# Patient Record
Sex: Female | Born: 1970
Health system: Southern US, Community
[De-identification: ages and names within clinical notes are randomized; demographics above are authoritative.]

## PROBLEM LIST (undated history)

## (undated) DIAGNOSIS — Z9889 Other specified postprocedural states: Secondary | ICD-10-CM

## (undated) DIAGNOSIS — I7300 Raynaud's syndrome without gangrene: Secondary | ICD-10-CM

## (undated) DIAGNOSIS — T4145XA Adverse effect of unspecified anesthetic, initial encounter: Secondary | ICD-10-CM

## (undated) DIAGNOSIS — R112 Nausea with vomiting, unspecified: Secondary | ICD-10-CM

## (undated) DIAGNOSIS — M5412 Radiculopathy, cervical region: Secondary | ICD-10-CM

## (undated) DIAGNOSIS — C801 Malignant (primary) neoplasm, unspecified: Secondary | ICD-10-CM

## (undated) DIAGNOSIS — T8859XA Other complications of anesthesia, initial encounter: Secondary | ICD-10-CM

## (undated) HISTORY — PX: ABDOMINAL HYSTERECTOMY: SHX81

---

## 2011-05-14 ENCOUNTER — Ambulatory Visit: Payer: Self-pay

## 2011-05-29 ENCOUNTER — Ambulatory Visit: Payer: Self-pay | Admitting: Internal Medicine

## 2012-10-02 ENCOUNTER — Ambulatory Visit: Payer: Self-pay | Admitting: Family Medicine

## 2015-10-06 ENCOUNTER — Other Ambulatory Visit: Payer: Self-pay | Admitting: Nurse Practitioner

## 2015-10-06 DIAGNOSIS — Z1231 Encounter for screening mammogram for malignant neoplasm of breast: Secondary | ICD-10-CM

## 2015-10-11 ENCOUNTER — Ambulatory Visit: Admission: RE | Admit: 2015-10-11 | Payer: Self-pay | Source: Ambulatory Visit

## 2015-10-18 ENCOUNTER — Ambulatory Visit: Payer: Self-pay

## 2015-11-01 ENCOUNTER — Ambulatory Visit
Admission: RE | Admit: 2015-11-01 | Discharge: 2015-11-01 | Disposition: A | Discharge status: Home / Self Care | Payer: BLUE CROSS/BLUE SHIELD | Source: Ambulatory Visit | Attending: Nurse Practitioner | Admitting: Nurse Practitioner

## 2015-11-01 DIAGNOSIS — Z1231 Encounter for screening mammogram for malignant neoplasm of breast: Secondary | ICD-10-CM | POA: Diagnosis present

## 2015-11-01 HISTORY — DX: Malignant (primary) neoplasm, unspecified: C80.1

## 2017-08-08 DIAGNOSIS — X32XXXA Exposure to sunlight, initial encounter: Secondary | ICD-10-CM | POA: Diagnosis not present

## 2017-08-08 DIAGNOSIS — D2261 Melanocytic nevi of right upper limb, including shoulder: Secondary | ICD-10-CM | POA: Diagnosis not present

## 2017-08-08 DIAGNOSIS — Z85820 Personal history of malignant melanoma of skin: Secondary | ICD-10-CM | POA: Diagnosis not present

## 2017-08-08 DIAGNOSIS — D225 Melanocytic nevi of trunk: Secondary | ICD-10-CM | POA: Diagnosis not present

## 2017-08-08 DIAGNOSIS — Z08 Encounter for follow-up examination after completed treatment for malignant neoplasm: Secondary | ICD-10-CM | POA: Diagnosis not present

## 2017-08-08 DIAGNOSIS — L570 Actinic keratosis: Secondary | ICD-10-CM | POA: Diagnosis not present

## 2017-08-13 ENCOUNTER — Other Ambulatory Visit: Payer: Self-pay | Admitting: Nurse Practitioner

## 2017-08-13 DIAGNOSIS — M542 Cervicalgia: Secondary | ICD-10-CM | POA: Diagnosis not present

## 2017-08-13 DIAGNOSIS — Z0000 Encounter for general adult medical examination without abnormal findings: Secondary | ICD-10-CM | POA: Diagnosis not present

## 2017-08-13 DIAGNOSIS — Z1231 Encounter for screening mammogram for malignant neoplasm of breast: Secondary | ICD-10-CM | POA: Diagnosis not present

## 2017-08-13 DIAGNOSIS — Z79899 Other long term (current) drug therapy: Secondary | ICD-10-CM | POA: Diagnosis not present

## 2017-08-23 DIAGNOSIS — M5412 Radiculopathy, cervical region: Secondary | ICD-10-CM | POA: Diagnosis not present

## 2017-09-13 ENCOUNTER — Ambulatory Visit
Admission: RE | Admit: 2017-09-13 | Discharge: 2017-09-13 | Disposition: A | Discharge status: Home / Self Care | Payer: 59 | Source: Ambulatory Visit | Attending: Nurse Practitioner | Admitting: Nurse Practitioner

## 2017-09-13 DIAGNOSIS — Z1231 Encounter for screening mammogram for malignant neoplasm of breast: Secondary | ICD-10-CM | POA: Diagnosis not present

## 2017-10-03 ENCOUNTER — Other Ambulatory Visit: Payer: 59

## 2018-01-02 ENCOUNTER — Other Ambulatory Visit: Payer: Self-pay

## 2018-01-02 ENCOUNTER — Encounter
Admission: RE | Admit: 2018-01-02 | Discharge: 2018-01-02 | Disposition: A | Discharge status: Home / Self Care | Payer: 59 | Source: Ambulatory Visit | Attending: Neurosurgery | Admitting: Neurosurgery

## 2018-01-02 ENCOUNTER — Other Ambulatory Visit: Payer: 59

## 2018-01-02 DIAGNOSIS — Z01818 Encounter for other preprocedural examination: Secondary | ICD-10-CM | POA: Insufficient documentation

## 2018-01-02 DIAGNOSIS — I498 Other specified cardiac arrhythmias: Secondary | ICD-10-CM | POA: Diagnosis not present

## 2018-01-02 DIAGNOSIS — Z0183 Encounter for blood typing: Secondary | ICD-10-CM | POA: Insufficient documentation

## 2018-01-02 DIAGNOSIS — Z01810 Encounter for preprocedural cardiovascular examination: Secondary | ICD-10-CM | POA: Diagnosis not present

## 2018-01-02 DIAGNOSIS — Z01812 Encounter for preprocedural laboratory examination: Secondary | ICD-10-CM | POA: Insufficient documentation

## 2018-01-02 HISTORY — DX: Radiculopathy, cervical region: M54.12

## 2018-01-02 HISTORY — DX: Nausea with vomiting, unspecified: R11.2

## 2018-01-02 HISTORY — DX: Adverse effect of unspecified anesthetic, initial encounter: T41.45XA

## 2018-01-02 HISTORY — DX: Raynaud's syndrome without gangrene: I73.00

## 2018-01-02 HISTORY — DX: Other specified postprocedural states: Z98.890

## 2018-01-02 HISTORY — DX: Other complications of anesthesia, initial encounter: T88.59XA

## 2018-01-02 LAB — BASIC METABOLIC PANEL
Anion gap: 7 (ref 5–15)
BUN: 19 mg/dL (ref 6–20)
CALCIUM: 8.9 mg/dL (ref 8.9–10.3)
CO2: 27 mmol/L (ref 22–32)
Chloride: 105 mmol/L (ref 98–111)
Creatinine, Ser: 0.6 mg/dL (ref 0.44–1.00)
GFR calc Af Amer: >60 mL/min (ref >60)
Glucose, Bld: 94 mg/dL (ref 70–99)
POTASSIUM: 4.3 mmol/L (ref 3.5–5.1)
SODIUM: 139 mmol/L (ref 135–145)

## 2018-01-02 LAB — URINALYSIS, ROUTINE W REFLEX MICROSCOPIC
Bacteria, UA: NONE SEEN
Bilirubin Urine: NEGATIVE
Glucose, UA: NEGATIVE mg/dL
HGB URINE DIPSTICK: NEGATIVE
Ketones, ur: NEGATIVE mg/dL
Leukocytes, UA: NEGATIVE
Nitrite: NEGATIVE
Protein, ur: NEGATIVE mg/dL
SPECIFIC GRAVITY, URINE: 1.031 — AB (ref 1.005–1.030)
SQUAMOUS EPITHELIAL / LPF: NONE SEEN (ref 0–5)
WBC, UA: NONE SEEN WBC/hpf (ref 0–5)
pH: 5 (ref 5.0–8.0)

## 2018-01-02 LAB — TYPE AND SCREEN
ABO/RH(D): O POS
ANTIBODY SCREEN: NEGATIVE

## 2018-01-02 LAB — CBC
HCT: 34.9 % — ABNORMAL LOW (ref 35.0–47.0)
Hemoglobin: 12.1 g/dL (ref 12.0–16.0)
MCH: 30.9 pg (ref 26.0–34.0)
MCHC: 34.7 g/dL (ref 32.0–36.0)
MCV: 89.1 fL (ref 80.0–100.0)
PLATELETS: 280 10*3/uL (ref 150–440)
RBC: 3.92 MIL/uL (ref 3.80–5.20)
RDW: 14.2 % (ref 11.5–14.5)
WBC: 9.5 10*3/uL (ref 3.6–11.0)

## 2018-01-02 LAB — SURGICAL PCR SCREEN
MRSA, PCR: NEGATIVE
Staphylococcus aureus: NEGATIVE

## 2018-01-02 LAB — DIFFERENTIAL
BASOS ABS: 0.1 10*3/uL (ref 0–0.1)
BASOS PCT: 1 %
Eosinophils Absolute: 0.3 10*3/uL (ref 0–0.7)
Eosinophils Relative: 3 %
Lymphocytes Relative: 29 %
Lymphs Abs: 2.8 10*3/uL (ref 1.0–3.6)
MONO ABS: 0.6 10*3/uL (ref 0.2–0.9)
Monocytes Relative: 6 %
NEUTROS ABS: 5.7 10*3/uL (ref 1.4–6.5)
Neutrophils Relative %: 61 %

## 2018-01-02 LAB — APTT: aPTT: 30 seconds (ref 24–36)

## 2018-01-02 LAB — PROTIME-INR
INR: 0.97
PROTHROMBIN TIME: 12.8 s (ref 11.4–15.2)

## 2018-01-02 NOTE — Patient Instructions (Signed)
Your procedure is scheduled on: Wednesday 01/09/18 Report to [facility]. To find out your arrival time please call [phone] between 1PM - 3PM on Tuesday 01/08/18.  Remember: Instructions that are not followed completely may result in serious medical risk, up to and including death, or upon the discretion of your surgeon and anesthesiologist your surgery may need to be rescheduled.     _X__ 1. Do not eat food after midnight the night before your procedure.                 No gum chewing or hard candies. You may drink clear liquids up to 2 hours                 before you are scheduled to arrive for your surgery- DO not drink clear                 liquids within 2 hours of the start of your surgery.                 Clear Liquids include:  water, apple juice without pulp, clear carbohydrate                 drink such as Clearfast or Gatorade, Black Coffee or Tea (Do not add                 anything to coffee or tea).  __X__2.  On the morning of surgery brush your teeth with toothpaste and water, you                 may rinse your mouth with mouthwash if you wish.  Do not swallow any              toothpaste of mouthwash.     _X__ 3.  No Alcohol for 24 hours before or after surgery.   _X__ 4.  Do Not Smoke or use e-cigarettes For 24 Hours Prior to Your Surgery.                 Do not use any chewable tobacco products for at least 6 hours prior to                 surgery.  ____  5.  Bring all medications with you on the day of surgery if instructed.   __X__  6.  Notify your doctor if there is any change in your medical condition      (cold, fever, infections).     Do not wear jewelry, make-up, hairpins, clips or nail polish. Do not wear lotions, powders, or perfumes.  Do not shave 48 hours prior to surgery. Men may shave face and neck. Do not bring valuables to the hospital.    [facility] is not responsible for any belongings or  valuables.  Contacts, dentures/partials or body piercings may not be worn into surgery. Bring a case for your contacts, glasses or hearing aids, a denture cup will be supplied. Leave your suitcase in the car. After surgery it may be brought to your room. For patients admitted to the hospital, discharge time is determined by your treatment team.   Patients discharged the day of surgery will not be allowed to drive home.   Please read over the following fact sheets that you were given:   MRSA Information  __X__ Take these medicines the morning of surgery with A SIP OF WATER:  1. NONE  2.   3.   4.  5.  6.  ____ Fleet Enema (as directed)   __X__ Use CHG Soap/SAGE wipes as directed  ____ Use inhalers on the day of surgery  ____ Stop metformin/Janumet/Farxiga 2 days prior to surgery    ____ Take 1/2 of usual insulin dose the night before surgery. No insulin the morning          of surgery.   ____ Stop Blood Thinners Coumadin/Plavix/Xarelto/Pleta/Pradaxa/Eliquis/Effient/Aspirin  on   Or contact your Surgeon, Cardiologist or Medical Doctor regarding  ability to stop your blood thinners  __X__ Stop Anti-inflammatories 7 days before surgery such as Advil, Ibuprofen, Motrin, BC or Goodies Powder, Naprosyn, Naproxen, Aleve, Aspirin YOU MAY TAKE YOUR TYLENOL   __X__ Stop all herbal supplements, fish oil or vitamin E until after surgery.    ____ Bring C-Pap to the hospital.

## 2018-01-09 ENCOUNTER — Ambulatory Visit: Payer: 59

## 2018-01-09 ENCOUNTER — Encounter: Payer: Self-pay | Admitting: *Deleted

## 2018-01-09 ENCOUNTER — Ambulatory Visit
Admission: RE | Admit: 2018-01-09 | Discharge: 2018-01-09 | Disposition: A | Discharge status: Home / Self Care | Payer: 59 | Source: Ambulatory Visit | Attending: Neurosurgery | Admitting: Neurosurgery

## 2018-01-09 ENCOUNTER — Encounter
Admission: RE | Disposition: A | Discharge status: Home / Self Care | Payer: Self-pay | Source: Ambulatory Visit | Attending: Neurosurgery

## 2018-01-09 ENCOUNTER — Other Ambulatory Visit: Payer: Self-pay

## 2018-01-09 DIAGNOSIS — Z85820 Personal history of malignant melanoma of skin: Secondary | ICD-10-CM | POA: Insufficient documentation

## 2018-01-09 DIAGNOSIS — I7300 Raynaud's syndrome without gangrene: Secondary | ICD-10-CM | POA: Diagnosis not present

## 2018-01-09 DIAGNOSIS — M2578 Osteophyte, vertebrae: Secondary | ICD-10-CM | POA: Insufficient documentation

## 2018-01-09 DIAGNOSIS — Z87891 Personal history of nicotine dependence: Secondary | ICD-10-CM | POA: Diagnosis not present

## 2018-01-09 DIAGNOSIS — Z981 Arthrodesis status: Secondary | ICD-10-CM

## 2018-01-09 DIAGNOSIS — Z90710 Acquired absence of both cervix and uterus: Secondary | ICD-10-CM | POA: Insufficient documentation

## 2018-01-09 DIAGNOSIS — Z79899 Other long term (current) drug therapy: Secondary | ICD-10-CM | POA: Diagnosis not present

## 2018-01-09 DIAGNOSIS — Z803 Family history of malignant neoplasm of breast: Secondary | ICD-10-CM | POA: Diagnosis not present

## 2018-01-09 DIAGNOSIS — Z419 Encounter for procedure for purposes other than remedying health state, unspecified: Secondary | ICD-10-CM

## 2018-01-09 DIAGNOSIS — M4322 Fusion of spine, cervical region: Secondary | ICD-10-CM | POA: Diagnosis not present

## 2018-01-09 DIAGNOSIS — I739 Peripheral vascular disease, unspecified: Secondary | ICD-10-CM | POA: Diagnosis not present

## 2018-01-09 DIAGNOSIS — M4802 Spinal stenosis, cervical region: Secondary | ICD-10-CM | POA: Insufficient documentation

## 2018-01-09 DIAGNOSIS — M5412 Radiculopathy, cervical region: Secondary | ICD-10-CM | POA: Insufficient documentation

## 2018-01-09 HISTORY — PX: CERVICAL DISC ARTHROPLASTY: SHX587

## 2018-01-09 LAB — ABO/RH: ABO/RH(D): O POS

## 2018-01-09 SURGERY — CERVICAL ANTERIOR DISC ARTHROPLASTY
Anesthesia: General | Site: Neck | Wound class: Clean

## 2018-01-09 MED ORDER — PROPOFOL 10 MG/ML IV BOLUS
INTRAVENOUS | Status: AC
  Filled 2018-01-09: qty 40

## 2018-01-09 MED ORDER — ROCURONIUM BROMIDE 100 MG/10ML IV SOLN
INTRAVENOUS | Status: DC | PRN
  Administered 2018-01-09: 10 mg via INTRAVENOUS
  Administered 2018-01-09: 5 mg via INTRAVENOUS
  Administered 2018-01-09: 40 mg via INTRAVENOUS
  Administered 2018-01-09: 5 mg via INTRAVENOUS

## 2018-01-09 MED ORDER — LACTATED RINGERS IV SOLN
INTRAVENOUS | Status: DC
  Administered 2018-01-09: 07:00:00 via INTRAVENOUS

## 2018-01-09 MED ORDER — SCOPOLAMINE 1 MG/3DAYS TD PT72
MEDICATED_PATCH | TRANSDERMAL | Status: AC
  Filled 2018-01-09: qty 1

## 2018-01-09 MED ORDER — GLYCOPYRROLATE 0.2 MG/ML IJ SOLN
INTRAMUSCULAR | Status: AC
  Filled 2018-01-09: qty 1

## 2018-01-09 MED ORDER — HYDROMORPHONE HCL 1 MG/ML IJ SOLN
INTRAMUSCULAR | Status: DC | PRN
  Administered 2018-01-09: .4 mg via INTRAVENOUS
  Administered 2018-01-09: .2 mg via INTRAVENOUS

## 2018-01-09 MED ORDER — MIDAZOLAM HCL 2 MG/2ML IJ SOLN
INTRAMUSCULAR | Status: AC
  Filled 2018-01-09: qty 2

## 2018-01-09 MED ORDER — DEXMEDETOMIDINE HCL IN NACL 200 MCG/50ML IV SOLN
INTRAVENOUS | Status: AC
  Filled 2018-01-09: qty 50

## 2018-01-09 MED ORDER — LACTATED RINGERS IV SOLN
INTRAVENOUS | Status: DC | PRN
  Administered 2018-01-09: 08:00:00 via INTRAVENOUS

## 2018-01-09 MED ORDER — BUPIVACAINE-EPINEPHRINE (PF) 0.5% -1:200000 IJ SOLN
INTRAMUSCULAR | Status: AC
  Filled 2018-01-09: qty 30

## 2018-01-09 MED ORDER — METHOCARBAMOL 500 MG PO TABS
ORAL_TABLET | ORAL | Status: AC
  Administered 2018-01-09: 500 mg
  Filled 2018-01-09: qty 1

## 2018-01-09 MED ORDER — ONDANSETRON HCL 4 MG/2ML IJ SOLN
INTRAMUSCULAR | Status: AC
  Filled 2018-01-09: qty 2

## 2018-01-09 MED ORDER — FENTANYL CITRATE (PF) 100 MCG/2ML IJ SOLN
25.0000 ug | INTRAMUSCULAR | Status: DC | PRN
  Administered 2018-01-09 (×4): 25 ug via INTRAVENOUS

## 2018-01-09 MED ORDER — FENTANYL CITRATE (PF) 100 MCG/2ML IJ SOLN
INTRAMUSCULAR | Status: DC | PRN
  Administered 2018-01-09 (×2): 50 ug via INTRAVENOUS

## 2018-01-09 MED ORDER — LIDOCAINE HCL (CARDIAC) PF 100 MG/5ML IV SOSY
PREFILLED_SYRINGE | INTRAVENOUS | Status: DC | PRN
  Administered 2018-01-09: 40 mg via INTRAVENOUS

## 2018-01-09 MED ORDER — FAMOTIDINE 20 MG PO TABS
20.0000 mg | ORAL_TABLET | Freq: Once | ORAL | Status: AC
  Administered 2018-01-09: 20 mg via ORAL

## 2018-01-09 MED ORDER — SODIUM CHLORIDE 0.9 % IJ SOLN
INTRAMUSCULAR | Status: AC
  Filled 2018-01-09: qty 50

## 2018-01-09 MED ORDER — METHYLPREDNISOLONE 4 MG PO TBPK: ORAL_TABLET | ORAL | 0 refills | Status: AC

## 2018-01-09 MED ORDER — ONDANSETRON HCL 4 MG/2ML IJ SOLN
INTRAMUSCULAR | Status: DC | PRN
  Administered 2018-01-09: 4 mg via INTRAVENOUS

## 2018-01-09 MED ORDER — FAMOTIDINE 20 MG PO TABS
ORAL_TABLET | ORAL | Status: AC
  Filled 2018-01-09: qty 1

## 2018-01-09 MED ORDER — MIDAZOLAM HCL 2 MG/2ML IJ SOLN
INTRAMUSCULAR | Status: DC | PRN
  Administered 2018-01-09: 2 mg via INTRAVENOUS

## 2018-01-09 MED ORDER — DEXAMETHASONE SODIUM PHOSPHATE 10 MG/ML IJ SOLN
INTRAMUSCULAR | Status: AC
  Filled 2018-01-09: qty 1

## 2018-01-09 MED ORDER — SCOPOLAMINE 1 MG/3DAYS TD PT72
1.0000 | MEDICATED_PATCH | Freq: Once | TRANSDERMAL | Status: DC
  Administered 2018-01-09: 1.5 mg via TRANSDERMAL

## 2018-01-09 MED ORDER — SUGAMMADEX SODIUM 200 MG/2ML IV SOLN
INTRAVENOUS | Status: DC | PRN
  Administered 2018-01-09: 120 mg via INTRAVENOUS

## 2018-01-09 MED ORDER — DEXMEDETOMIDINE HCL IN NACL 200 MCG/50ML IV SOLN
INTRAVENOUS | Status: DC | PRN
  Administered 2018-01-09 (×2): 8 ug via INTRAVENOUS
  Administered 2018-01-09: 4 ug via INTRAVENOUS

## 2018-01-09 MED ORDER — GLYCOPYRROLATE 0.2 MG/ML IJ SOLN
INTRAMUSCULAR | Status: DC | PRN
  Administered 2018-01-09: 0.2 mg via INTRAVENOUS

## 2018-01-09 MED ORDER — SEVOFLURANE IN SOLN
RESPIRATORY_TRACT | Status: AC
  Filled 2018-01-09: qty 250

## 2018-01-09 MED ORDER — EPHEDRINE SULFATE 50 MG/ML IJ SOLN
INTRAMUSCULAR | Status: AC
  Filled 2018-01-09: qty 1

## 2018-01-09 MED ORDER — CEFAZOLIN SODIUM-DEXTROSE 2-4 GM/100ML-% IV SOLN
INTRAVENOUS | Status: AC
  Filled 2018-01-09: qty 100

## 2018-01-09 MED ORDER — SODIUM CHLORIDE 0.9 % IV SOLN
INTRAVENOUS | Status: DC | PRN
  Administered 2018-01-09: 40 ug/min via INTRAVENOUS

## 2018-01-09 MED ORDER — LIDOCAINE HCL (PF) 2 % IJ SOLN
INTRAMUSCULAR | Status: AC
  Filled 2018-01-09: qty 10

## 2018-01-09 MED ORDER — HYDROMORPHONE HCL 1 MG/ML IJ SOLN
INTRAMUSCULAR | Status: AC
  Filled 2018-01-09: qty 1

## 2018-01-09 MED ORDER — BUPIVACAINE-EPINEPHRINE (PF) 0.5% -1:200000 IJ SOLN
INTRAMUSCULAR | Status: DC | PRN
  Administered 2018-01-09: 5 mL

## 2018-01-09 MED ORDER — FENTANYL CITRATE (PF) 100 MCG/2ML IJ SOLN
INTRAMUSCULAR | Status: AC
  Administered 2018-01-09: 25 ug via INTRAVENOUS
  Filled 2018-01-09: qty 2

## 2018-01-09 MED ORDER — ONDANSETRON HCL 4 MG PO TABS: 4.0000 mg | ORAL_TABLET | Freq: Three times a day (TID) | ORAL | 0 refills | Status: AC | PRN

## 2018-01-09 MED ORDER — OXYCODONE HCL 5 MG PO TABS: 5.0000 mg | ORAL_TABLET | ORAL | 0 refills | Status: AC | PRN

## 2018-01-09 MED ORDER — BACITRACIN 50000 UNITS IM SOLR
INTRAMUSCULAR | Status: AC
  Filled 2018-01-09: qty 1

## 2018-01-09 MED ORDER — FENTANYL CITRATE (PF) 100 MCG/2ML IJ SOLN
INTRAMUSCULAR | Status: AC
  Filled 2018-01-09: qty 2

## 2018-01-09 MED ORDER — PROPOFOL 10 MG/ML IV BOLUS
INTRAVENOUS | Status: DC | PRN
  Administered 2018-01-09: 140 mg via INTRAVENOUS

## 2018-01-09 MED ORDER — ONDANSETRON HCL 4 MG/2ML IJ SOLN
4.0000 mg | Freq: Once | INTRAMUSCULAR | Status: AC | PRN
  Administered 2018-01-09: 4 mg via INTRAVENOUS

## 2018-01-09 MED ORDER — PHENYLEPHRINE HCL 10 MG/ML IJ SOLN
INTRAMUSCULAR | Status: DC | PRN
  Administered 2018-01-09 (×2): 200 ug via INTRAVENOUS
  Administered 2018-01-09: 100 ug via INTRAVENOUS
  Administered 2018-01-09 (×2): 200 ug via INTRAVENOUS

## 2018-01-09 MED ORDER — PHENYLEPHRINE HCL 10 MG/ML IJ SOLN
INTRAMUSCULAR | Status: AC
  Filled 2018-01-09: qty 1

## 2018-01-09 MED ORDER — DIPHENHYDRAMINE HCL 50 MG/ML IJ SOLN
INTRAMUSCULAR | Status: DC | PRN
  Administered 2018-01-09: 12.5 mg via INTRAVENOUS

## 2018-01-09 MED ORDER — OXYCODONE HCL 5 MG PO TABS
ORAL_TABLET | ORAL | Status: AC
  Administered 2018-01-09: 5 mg
  Filled 2018-01-09: qty 1

## 2018-01-09 MED ORDER — SUGAMMADEX SODIUM 200 MG/2ML IV SOLN
INTRAVENOUS | Status: AC
  Filled 2018-01-09: qty 2

## 2018-01-09 MED ORDER — EPHEDRINE SULFATE 50 MG/ML IJ SOLN
INTRAMUSCULAR | Status: DC | PRN
  Administered 2018-01-09 (×2): 10 mg via INTRAVENOUS
  Administered 2018-01-09: 5 mg via INTRAVENOUS

## 2018-01-09 MED ORDER — DIPHENHYDRAMINE HCL 50 MG/ML IJ SOLN
INTRAMUSCULAR | Status: AC
  Filled 2018-01-09: qty 1

## 2018-01-09 MED ORDER — ROCURONIUM BROMIDE 50 MG/5ML IV SOLN
INTRAVENOUS | Status: AC
  Filled 2018-01-09: qty 1

## 2018-01-09 MED ORDER — THROMBIN 5000 UNITS EX SOLR
CUTANEOUS | Status: AC
  Filled 2018-01-09: qty 5000

## 2018-01-09 MED ORDER — CEFAZOLIN SODIUM-DEXTROSE 2-4 GM/100ML-% IV SOLN
2.0000 g | Freq: Once | INTRAVENOUS | Status: AC
  Administered 2018-01-09: 2 g via INTRAVENOUS

## 2018-01-09 MED ORDER — THROMBIN 5000 UNITS EX SOLR
CUTANEOUS | Status: DC | PRN
  Administered 2018-01-09: 5000 [IU] via TOPICAL

## 2018-01-09 MED ORDER — SODIUM CHLORIDE 0.9 % IR SOLN
Status: DC | PRN
  Administered 2018-01-09: 1000 mL

## 2018-01-09 MED ORDER — DEXAMETHASONE SODIUM PHOSPHATE 10 MG/ML IJ SOLN
INTRAMUSCULAR | Status: DC | PRN
  Administered 2018-01-09: 10 mg via INTRAVENOUS

## 2018-01-09 MED ORDER — METHOCARBAMOL 500 MG PO TABS: 500.0000 mg | ORAL_TABLET | Freq: Four times a day (QID) | ORAL | 0 refills | Status: AC | PRN

## 2018-01-09 SURGICAL SUPPLY — 52 items
BUR NEURO DRILL SOFT 3.0X3.8M (BURR) ×3 IMPLANT
CANISTER SUCT 1200ML W/VALVE (MISCELLANEOUS) ×6 IMPLANT
CHLORAPREP W/TINT 26ML (MISCELLANEOUS) ×6 IMPLANT
COUNTER NEEDLE 20/40 LG (NEEDLE) ×3 IMPLANT
COVER LIGHT HANDLE STERIS (MISCELLANEOUS) ×6 IMPLANT
CRADLE LAMINECT ARM (MISCELLANEOUS) ×3 IMPLANT
CUP MEDICINE 2OZ PLAST GRAD ST (MISCELLANEOUS) ×6 IMPLANT
DERMABOND ADVANCED (GAUZE/BANDAGES/DRESSINGS) ×2
DERMABOND ADVANCED .7 DNX12 (GAUZE/BANDAGES/DRESSINGS) ×1 IMPLANT
DISC MOBI-C CERVICAL 15X7X15 (Neuro Prosthesis/Implant) ×6 IMPLANT
DRAPE C-ARM 42X72 X-RAY (DRAPES) ×6 IMPLANT
DRAPE LAPAROTOMY 77X122 PED (DRAPES) ×3 IMPLANT
DRAPE MICROSCOPE SPINE 48X150 (DRAPES) ×3 IMPLANT
DRAPE POUCH INSTRU U-SHP 10X18 (DRAPES) ×3 IMPLANT
DRAPE SURG 17X11 SM STRL (DRAPES) ×12 IMPLANT
ELECT CAUTERY BLADE TIP 2.5 (TIP) ×3
ELECT REM PT RETURN 9FT ADLT (ELECTROSURGICAL) ×3
ELECTRODE CAUTERY BLDE TIP 2.5 (TIP) ×1 IMPLANT
ELECTRODE REM PT RTRN 9FT ADLT (ELECTROSURGICAL) ×1 IMPLANT
FEE INTRAOP MONITOR IMPULS NCS (MISCELLANEOUS) IMPLANT
FRAME EYE SHIELD (PROTECTIVE WEAR) ×6 IMPLANT
GLOVE BIOGEL PI IND STRL 7.0 (GLOVE) ×1 IMPLANT
GLOVE BIOGEL PI INDICATOR 7.0 (GLOVE) ×2
GLOVE SURG SYN 6.5 ES PF (GLOVE) ×6 IMPLANT
GLOVE SURG SYN 8.5  E (GLOVE) ×6
GLOVE SURG SYN 8.5 E (GLOVE) ×3 IMPLANT
GOWN SRG XL LVL 3 NONREINFORCE (GOWNS) ×1 IMPLANT
GOWN STRL NON-REIN TWL XL LVL3 (GOWNS) ×2
GOWN STRL REUS W/TWL MED LVL3 (GOWN DISPOSABLE) ×3 IMPLANT
GRADUATE 1200CC STRL 31836 (MISCELLANEOUS) ×3 IMPLANT
INTRAOP MONITOR FEE IMPULS NCS (MISCELLANEOUS)
INTRAOP MONITOR FEE IMPULSE (MISCELLANEOUS)
KIT TURNOVER KIT A (KITS) ×3 IMPLANT
MARKER SKIN DUAL TIP RULER LAB (MISCELLANEOUS) ×3 IMPLANT
NDL SAFETY ECLIPSE 18X1.5 (NEEDLE) IMPLANT
NEEDLE HYPO 18GX1.5 SHARP (NEEDLE)
NEEDLE HYPO 22GX1.5 SAFETY (NEEDLE) ×3 IMPLANT
NS IRRIG 1000ML POUR BTL (IV SOLUTION) ×3 IMPLANT
PACK LAMINECTOMY NEURO (CUSTOM PROCEDURE TRAY) ×3 IMPLANT
PIN CASPAR SPINAL 12MM (PIN) ×3 IMPLANT
SPOGE SURGIFLO 8M (HEMOSTASIS) ×4
SPONGE KITTNER 5P (MISCELLANEOUS) ×3 IMPLANT
SPONGE SURGIFLO 8M (HEMOSTASIS) ×2 IMPLANT
STAPLER SKIN PROX 35W (STAPLE) IMPLANT
SUT V-LOC 90 ABS DVC 3-0 CL (SUTURE) ×3 IMPLANT
SUT VIC AB 3-0 SH 8-18 (SUTURE) ×3 IMPLANT
SYR 30ML LL (SYRINGE) ×3 IMPLANT
TAPE ADH 3 LX (MISCELLANEOUS) ×3 IMPLANT
TOWEL OR 17X26 4PK STRL BLUE (TOWEL DISPOSABLE) ×9 IMPLANT
TRAY FOLEY MTR SLVR 16FR STAT (SET/KITS/TRAYS/PACK) IMPLANT
TUBING CONNECTING 10 (TUBING) ×2 IMPLANT
TUBING CONNECTING 10' (TUBING) ×1

## 2018-01-09 NOTE — Anesthesia Post-op Follow-up Note (Signed)
Anesthesia QCDR form completed.        

## 2018-01-09 NOTE — OR Nursing (Signed)
Marin Olp in to see pt 1155 am

## 2018-01-09 NOTE — H&P (Signed)
Referring Physician:  No referring provider defined for this encounter.  Primary Physician:  Janet Lange, NP  Chief Complaint:  Right arm numbness and tingling  History of Present Illness: Janet Sandoval is a 47 y.o. female who presents with the chief complaint of intermittent right arm numbness and tingling for about 1 year with pain in right shoulder blade. Initially evaluated by Janet Sandoval on 08/23/2017.  Progressing to right arm in the past 1-2 months. Associated with neck pain and headaches. Aggravated by neck positioning; relieved with ibuprofen and gabapentin. Denies left extremity pain, weakness in upper extremities, imbalance, fine motor movements.   No recent illness, infections.   Janet Sandoval has no symptoms of cervical myelopathy.  The symptoms are causing a significant impact on the patient's life.   Review of Systems:  A 10 point review of systems is negative, except for the pertinent positives and negatives detailed in the HPI.  Past Medical History: Past Medical History:  Diagnosis Date  . Cancer ([place])    melanoma  . Cervical radiculopathy   . Complication of anesthesia    low BP  . PONV (postoperative nausea and vomiting)    after c-section  . Raynaud's disease     Past Surgical History: Past Surgical History:  Procedure Laterality Date  . ABDOMINAL HYSTERECTOMY    . CESAREAN SECTION      Allergies: Allergies as of 09/18/2017  . (Not on File)    Medications:  Current Facility-Administered Medications:  .  ceFAZolin (ANCEF) 2-4 GM/100ML-% IVPB, , , ,  .  ceFAZolin (ANCEF) IVPB 2g/100 mL premix, 2 g, Intravenous, Once, Janet Maw, MD .  famotidine (PEPCID) 20 MG tablet, , , ,  .  lactated ringers infusion, , Intravenous, Continuous, Janet Clan, MD, Last Rate: 75 mL/hr at 01/09/18 0644 .  scopolamine (TRANSDERM-SCOP) 1 MG/3DAYS 1.5 mg, 1 patch, Transdermal, Once, Janet Clan, MD, 1.5 mg at 01/09/18 0630 .   scopolamine (TRANSDERM-SCOP) 1 MG/3DAYS, , , ,    Social History: Social History   Tobacco Use  . Smoking status: Former Smoker    Last attempt to quit: 2012    Years since quitting: 7.6  . Smokeless tobacco: Never Used  Substance Use Topics  . Alcohol use: Not Currently    Comment: rare  . Drug use: Never    Family Medical History: Family History  Problem Relation Age of Onset  . Breast cancer Sister 60    Physical Examination: Vitals:   01/09/18 0614  BP: 108/81  Pulse: 76  Resp: 12  Temp: (!) 97.2 F (36.2 C)  SpO2: 100%     General: Patient is well developed, well nourished, calm, collected, and in no apparent distress.  Psychiatric: Patient is non-anxious.  Head:  Pupils equal, round, and reactive to light.  ENT:  Oral mucosa appears well hydrated.  Neck:   Supple.  Full range of motion.  CV:   RRR  Respiratory: Patient is breathing without any difficulty. Clear to auscultation bilaterally   Extremities: No edema.  Skin:   On exposed skin, there are no abnormal skin lesions.  NEUROLOGICAL:  General: In no acute distress.   Awake, alert, oriented to person, place, and time.  Pupils equal round and reactive to light.  Facial tone is symmetric.      Strength: Side Biceps Triceps Deltoid Interossei Grip Wrist Ext. Wrist Flex.  R 5 5 5 5 5 5 5   L 5 5 5 5 5  5  5   Side Iliopsoas Quads Hamstring PF DF EHL  R 5 5 5 5 5 5   L 5 5 5 5 5 5     Assessment and Plan: [name] is a pleasant 47 y.o. female with cervical radiculopathy here for C5-7 arthroplasty. Procedure was reviewed with patient. Risks and benefits have already been discussed with Janet Sandoval. All questions and concerns addressed.   Janet Olp, PA-C Dept. of Neurosurgery

## 2018-01-09 NOTE — Discharge Summary (Signed)
Procedure: C5-7 Arthroplasty Procedure Date: 01/09/2018 Diagnosis: cervical radiculopathy   History: Janet Sandoval is here for C5-7 arthroplasty for cervical radiculopathy. Tolerated procedure well without complication.  Evaluated in recovery; still disoriented from anesthesia. Complains of anterior and posterior neck pain. Pain more prominent at incision site.  Intermittent symptoms prior to surgery so she is unable to determine if symptoms have improved/resolved. Denies any new symptoms of pain/numbness/tingling    Physical Exam: Vitals:   01/09/18 0614  BP: 108/81  Pulse: 76  Resp: 12  Temp: (!) 97.2 F (36.2 C)  SpO2: 100%    AA Ox3 CNI Skin: Glue intact. No bleeding  Strength:5/5 upper and lower extremities Sensation: intact and symmetric upper and lower extremities   Data:  No results for input(s): NA, K, CL, CO2, BUN, CREATININE, LABGLOM, GLUCOSE, CALCIUM in the last 168 hours. No results for input(s): AST, ALT, ALKPHOS in the last 168 hours.  Invalid input(s): TBILI   No results for input(s): WBC, HGB, HCT, PLT in the last 168 hours. No results for input(s): APTT, INR in the last 168 hours.       Other tests/results: Xray pending  Assessment/Plan:  Janet Sandoval is POD0 s/p C5-7 arthroplasty for cervical radiculopathy. Recovering well. Needs time to determine if symptoms have resolved, but no symptoms present. Will continue pain control with tylenol, ibuprofen, oxycodone, and muscle relaxer as needed. Will also provided medrol dosepack to decrease any associated swallowing difficulties.  2 week follow up in clinic scheduled to monitor progress. Patient was advised to contact office if any questions or concerns arise prior to then.   Marin Olp PA-C Department of Neurosurgery

## 2018-01-09 NOTE — Transfer of Care (Signed)
Immediate Anesthesia Transfer of Care Note  Patient: [name]  Procedure(s) Performed: CERVICAL ANTERIOR DISC ARTHROPLASTYC5-7 (N/A Neck)  Patient Location: PACU  Anesthesia Type:General  Level of Consciousness: awake  Airway & Oxygen Therapy: Patient Spontanous Breathing and Patient connected to face mask oxygen  Post-op Assessment: Report given to RN and Post -op Vital signs reviewed and stable  Post vital signs: Reviewed  Last Vitals:  Vitals Value Taken Time  BP 106/64 01/09/2018  9:46 AM  Temp    Pulse 93 01/09/2018  9:47 AM  Resp 14 01/09/2018  9:47 AM  SpO2 100 % 01/09/2018  9:47 AM  Vitals shown include unvalidated device data.  Last Pain:  Vitals:   01/09/18 0614  TempSrc: Temporal  PainSc: 5          Complications: No apparent anesthesia complications

## 2018-01-09 NOTE — Anesthesia Procedure Notes (Addendum)
Procedure Name: Intubation Date/Time: 01/09/2018 7:38 AM Performed by: Rolla Plate, CRNA Pre-anesthesia Checklist: Patient identified, Patient being monitored, Timeout performed, Emergency Drugs available and Suction available Patient Re-evaluated:Patient Re-evaluated prior to induction Oxygen Delivery Method: Circle system utilized Preoxygenation: Pre-oxygenation with 100% oxygen Induction Type: IV induction Ventilation: Mask ventilation without difficulty and Oral airway inserted - appropriate to patient size Laryngoscope Size: 3 and McGraph Grade View: Grade I Tube type: Oral Tube size: 7.0 mm Number of attempts: 1 Airway Equipment and Method: Stylet Placement Confirmation: ETT inserted through vocal cords under direct vision,  positive ETCO2 and breath sounds checked- equal and bilateral Secured at: 21 cm Tube secured with: Tape Dental Injury: Teeth and Oropharynx as per pre-operative assessment

## 2018-01-09 NOTE — Discharge Instructions (Addendum)
Your surgeon has performed an operation on your cervical spine (neck) to relieve pressure on the spinal cord and/or nerves. This involved making an incision in the front of your neck and removing one or more of the discs that support your spine. Next, a small piece of bone was used to fuse two or more of the vertebrae (bones) together.  The following are instructions to help in your recovery once you have been discharged from the hospital. Even if you feel well, it is important that you follow these activity guidelines. If you do not let your neck heal properly from the surgery, you can increase the chance of return of your symptoms and other complications.  Activity    No bending, lifting, or twisting (BLT). Avoid lifting objects heavier than 10 pounds (gallon milk jug).  Where possible, avoid household activities that involve lifting, bending, reaching, pushing, or pulling such as laundry, vacuuming, grocery shopping, and childcare. Try to arrange for help from friends and family for these activities while your back heals.  Increase physical activity slowly as tolerated.  Taking short walks is encouraged, but avoid strenuous exercise. Do not jog, run, bicycle, lift weights, or participate in any other exercises unless specifically allowed by your doctor.  Talk to your doctor before resuming sexual activity.  You should not drive until cleared by your doctor.  Until released by your doctor, you should not return to work or school.  You should rest at home and let your body heal.   You may shower three days after your surgery.  After showering, lightly dab your incision dry. Do not take a tub bath or go swimming until approved by your doctor at your follow-up appointment.  If your doctor ordered a cervical collar (neck brace) for you, you should wear it whenever you are out of bed. You may remove it when lying down or sleeping, but you should wear it at all other times. Not all neck surgeries  require a cervical collar.  If you smoke, we strongly recommend that you quit.  Smoking has been proven to interfere with normal bone healing and will dramatically reduce the success rate of your surgery. Please contact [facility] (800-QUIT-NOW) and use the resources at www.[facility].com for assistance in stopping smoking.  Surgical Incision   If you have a dressing on your incision, you may remove it two days after your surgery. Keep your incision area clean and dry.  If you have staples or stitches on your incision, you should have a follow up scheduled for removal. If you do not have staples or stitches, you will have steri-strips (small pieces of surgical tape) or Dermabond glue. The steri-strips/glue should begin to peel away within about a week (it is fine if the steri-strips fall off before then). If the strips are still in place one week after your surgery, you may gently remove them.  Diet           You may return to your usual diet. However, you may experience discomfort when swallowing in the first month after your surgery. This is normal. You may find that softer foods are more comfortable for you to swallow. Be sure to stay hydrated.  When to Contact us  You may experience pain in your neck and/or pain between your shoulder blades. This is normal and should improve in the next few weeks with the help of pain medication, muscle relaxers, and rest. Some patients report that a warm compress on the back of the  neck or between the shoulder blades helps.  However, should you experience any of the following, contact us immediately:  New numbness or weakness  Pain that is progressively getting worse, and is not relieved by your pain medication, muscle relaxers, rest, and warm compresses  Bleeding, redness, swelling, pain, or drainage from surgical incision  Chills or flu-like symptoms  Fever greater than 101.0 F (38.3 C)  Inability to eat, drink fluids, or take  medications  Problems with bowel or bladder functions  Difficulty breathing or shortness of breath  Warmth, tenderness, or swelling in your calf Contact Information  During office hours (Monday-Friday 9 am to 5 pm), please call your physician at [phone] and ask for Berdine Addison  After hours and weekends, please call the [facility] Operator at [phone] and ask for the Neurosurgery Resident On Call   For a life-threatening emergency, call [place]   1) The drugs that you were given will stay in your system until tomorrow so for the next 24 hours you should not:  A) Drive an automobile B) Make any legal decisions C) Drink any alcoholic beverage   2) You may resume regular meals tomorrow.  Today it is better to start with liquids and gradually work up to solid foods.  You may eat anything you prefer, but it is better to start with liquids, then soup and crackers, and gradually work up to solid foods.   3) Please notify your doctor immediately if you have any unusual bleeding, trouble breathing, redness and pain at the surgery site, drainage, fever, or pain not relieved by medication.    4) Additional Instructions:        Please contact your physician with any problems or Same Day Surgery at [phone], Monday through Friday 6 am to 4 pm, or [facility] [place] at [facility] number at [phone].

## 2018-01-09 NOTE — OR Nursing (Signed)
Dr Izora Ribas consulted about pt Discharge. States OK to discharge.

## 2018-01-09 NOTE — Anesthesia Preprocedure Evaluation (Signed)
Anesthesia Evaluation  Patient identified by MRN, date of birth, ID band Patient awake    Reviewed: Allergy & Precautions, NPO status , Patient's Chart, lab work & pertinent test results  History of Anesthesia Complications (+) PONV and history of anesthetic complications  Airway Mallampati: II       Dental   Pulmonary former smoker,    Pulmonary exam normal        Cardiovascular + Peripheral Vascular Disease  Normal cardiovascular exam     Neuro/Psych  Neuromuscular disease negative psych ROS   GI/Hepatic negative GI ROS, Neg liver ROS,   Endo/Other  negative endocrine ROS  Renal/GU negative Renal ROS  negative genitourinary   Musculoskeletal negative musculoskeletal ROS (+)   Abdominal Normal abdominal exam  (+)   Peds negative pediatric ROS (+)  Hematology negative hematology ROS (+)   Anesthesia Other Findings   Reproductive/Obstetrics                             Anesthesia Physical Anesthesia Plan  ASA: II  Anesthesia Plan: General   Post-op Pain Management:    Induction: Intravenous  PONV Risk Score and Plan:   Airway Management Planned: Oral ETT  Additional Equipment:   Intra-op Plan:   Post-operative Plan: Extubation in OR  Informed Consent: I have reviewed the patients History and Physical, chart, labs and discussed the procedure including the risks, benefits and alternatives for the proposed anesthesia with the patient or authorized representative who has indicated his/her understanding and acceptance.   Dental advisory given  Plan Discussed with: CRNA and Surgeon  Anesthesia Plan Comments:         Anesthesia Quick Evaluation

## 2018-01-09 NOTE — OR Nursing (Signed)
Xray called. States to send her down. Pt taken via wheelchair to xray.

## 2018-01-09 NOTE — Op Note (Signed)
Indications: Janet Sandoval is a 47 yo female who presented with cervical radiculopathy.  She failed conservative management and elected for surgical intervention.  Findings: foraminal stenosis at C5-6 and C6-7  Preoperative Diagnosis: Cervical radiculopathy Postoperative Diagnosis: same   EBL: 25 ml IVF: 1200 ml Drains: none Disposition: Extubated and Stable to PACU Complications: none  No foley catheter was placed.   Preoperative Note:   Risks of surgery discussed include: infection, bleeding, stroke, coma, death, paralysis, CSF leak, nerve/spinal cord injury, numbness, tingling, weakness, complex regional pain syndrome, recurrent stenosis and/or disc herniation, vascular injury, development of instability, neck/back pain, need for further surgery, persistent symptoms, development of deformity, and the risks of anesthesia. The patient understood these risks and agreed to proceed.  Operative Note:   Operative Note:  Procedure:  1) Cervical Disc Arthroplasty at C5/6 and C6/7 using a LDR Mobi-C device   Procedure: After obtaining informed consent, the patient taken to the operating room, placed in supine position, general anesthesia induced.  The patient had a small shoulder roll placed behind the neck.  The patient received preop antibiotics and IV Decadron.  The patient had a neck incision outlined, was prepped and draped in usual sterile fashion. The incision was injected with local anesthetic.   An incision was opened, dissection taken down medial to the carotid artery and jugular vein, lateral to the trachea and esophagus.  The prevertebral fascia identified and a localizing x-ray demonstrated the correct level.  The longus colli were dissected laterally, and self-retaining retractors placed to open the operative field. The microscope was then brought into the field.  With this complete, distractor pins were placed in the vertebral bodies of C5 and C7. The distractor was placed, then  the anulus at C6/7 was opened using a bovie.  Curettes and pituitary rongeurs used to remove the majority of disk, then the drill was used to remove the posterior osteophyte and begin the foraminotomies. The nerve hook was used to elevate the posterior longitudinal ligament, which was then removed with Kerrison rongeurs. The microblunt nerve hook could be passed out the foramen bilateral at each level.   Meticulous hemostasis was obtained.  A trial spacer was used to size the disc space. Using flouroscopic guidance, a 15 mm width x 15 mm depth x 5 mm height Mobi-C was then inserted in the prepared disc space.  Then, the anulus at C5/6 was opened using a bovie.  Curettes and pituitary rongeurs used to remove the majority of disk, then the drill was used to remove the posterior osteophyte and begin the foraminotomies. The nerve hook was used to elevate the posterior longitudinal ligament, which was then removed with Kerrison rongeurs. The microblunt nerve hook could be passed out the foramen bilateral at each level.   Meticulous hemostasis was obtained.  A trial spacer was used to size the disc space. Using flouroscopic guidance, a 15 mm width x 15 mm depth x 5 mm height Mobi-C was then inserted in the prepared disc space.  The caspar distractor was removed, and bone wax used for hemostasis. Final AP and lateral radiographs were taken.   With the disc arthroplasties in good position, the wound was irrigated copiously with bacitracin-containing solution and meticulous hemostasis obtained.  Wound was closed in 2 layers using interrupted inverted 3-0 Vicryl sutures.  The wound was dressed with dermabond, the head of bed at 30 degrees, taken to recovery room in stable condition.  No new postop neurological deficits were identified.  Sponge and  pattie counts were correct at the end of the procedure.   I performed the entire procedure with the assistance of Marin Olp PA as an Pensions consultant.  Meade Maw MD

## 2018-01-09 NOTE — OR Nursing (Signed)
Discharge instructions discussed with pt and husband. Both voice understanding. 

## 2018-01-10 NOTE — Anesthesia Postprocedure Evaluation (Signed)
Anesthesia Post Note  Patient: Janet Sandoval  Procedure(s) Performed: CERVICAL ANTERIOR DISC ARTHROPLASTYC5-7 (N/A Neck)  Patient location during evaluation: PACU Anesthesia Type: General Level of consciousness: awake and alert and oriented Pain management: pain level controlled Vital Signs Assessment: post-procedure vital signs reviewed and stable Respiratory status: spontaneous breathing Cardiovascular status: blood pressure returned to baseline Anesthetic complications: no     Last Vitals:  Vitals:   01/09/18 1109 01/09/18 1244  BP: (!) 95/57 (!) 91/55  Pulse: 68 72  Resp:  18  Temp: 36.4 C   SpO2: 98% 98%    Last Pain:  Vitals:   01/09/18 1244  TempSrc:   PainSc: 4                  Rhian Funari

## 2018-02-07 DIAGNOSIS — Z981 Arthrodesis status: Secondary | ICD-10-CM | POA: Diagnosis not present

## 2018-02-07 DIAGNOSIS — M5412 Radiculopathy, cervical region: Secondary | ICD-10-CM | POA: Diagnosis not present

## 2018-02-07 DIAGNOSIS — M47812 Spondylosis without myelopathy or radiculopathy, cervical region: Secondary | ICD-10-CM | POA: Diagnosis not present

## 2018-03-21 DIAGNOSIS — Z981 Arthrodesis status: Secondary | ICD-10-CM | POA: Diagnosis not present

## 2018-03-21 DIAGNOSIS — M5412 Radiculopathy, cervical region: Secondary | ICD-10-CM | POA: Diagnosis not present

## 2018-04-10 DIAGNOSIS — H524 Presbyopia: Secondary | ICD-10-CM | POA: Diagnosis not present

## 2018-05-17 ENCOUNTER — Telehealth: Payer: 59 | Admitting: Family

## 2018-05-17 ENCOUNTER — Encounter: Payer: Self-pay | Admitting: Family

## 2018-05-17 DIAGNOSIS — J020 Streptococcal pharyngitis: Secondary | ICD-10-CM

## 2018-05-17 MED ORDER — AMOXICILLIN 500 MG PO CAPS: 500.0000 mg | ORAL_CAPSULE | Freq: Two times a day (BID) | ORAL | 0 refills | Status: AC

## 2018-05-17 NOTE — Progress Notes (Signed)
Thank you for the details you included in the comment boxes. Those details are very helpful in determining the best course of treatment for you and help [place] to provide the best care. With the white patches and the fever, this may be consistent with strep throat. 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 If so, take the antibiotics. If you have known contact with a person with strep, take the antibiotics now.   We are sorry that you are not feeling well.  Here is how we plan to help!  Based on what you have shared with me it is likely that you have strep pharyngitis.  Strep pharyngitis is inflammation and infection in the back of the throat.  This is an infection cause by bacteria and is treated with antibiotics.  I have prescribed Amoxicillin 500mg , by mouth twice daily for 10 days. . For throat pain, we recommend over the counter oral pain relief medications such as acetaminophen or aspirin, or anti-inflammatory medications such as ibuprofen or naproxen sodium. Topical treatments such as oral throat lozenges or sprays may be used as needed. Strep infections are not as easily transmitted as other respiratory infections, however we still recommend that you avoid close contact with loved ones, especially the very young and elderly.  Remember to wash your hands thoroughly throughout the day as this is the number one way to prevent the spread of infection and wipe down door knobs and counters with disinfectant.   Home Care:  Only take medications as instructed by your medical team.  Complete the entire course of an antibiotic.  Do not take these medications with alcohol.  A steam or ultrasonic humidifier can help congestion.  You can place a towel over your head and breathe in the steam from hot water coming from a faucet.  Avoid close contacts especially the very young and  the elderly.  Cover your mouth when you cough or sneeze.  Always remember to wash your hands.  Get Help Right Away If:  You develop worsening fever or sinus pain.  You develop a severe head ache or visual changes.  Your symptoms persist after you have completed your treatment plan.  Make sure you  Understand these instructions.  Will watch your condition.  Will get help right away if you are not doing well or get worse.  Your e-visit answers were reviewed by a board certified advanced clinical practitioner to complete your personal care plan.  Depending on the condition, your plan could have included both over the counter or prescription medications.  If there is a problem please reply  once you have received a response from your provider.  Your safety is important to [place].  If you have drug allergies check your prescription carefully.    You can use MyChart to ask questions about today's visit, request a non-urgent call back, or ask for a work or school excuse for 24 hours related to this e-Visit. If it has been greater than 24 hours you will need to follow up with your provider, or enter a new e-Visit to address those concerns.  You will get an e-mail in the next two days asking about your experience.  I hope that your e-visit has been valuable and will speed your recovery. Thank you for using e-visits.

## 2018-10-31 DIAGNOSIS — M50123 Cervical disc disorder at C6-C7 level with radiculopathy: Secondary | ICD-10-CM | POA: Diagnosis not present

## 2018-10-31 DIAGNOSIS — M5412 Radiculopathy, cervical region: Secondary | ICD-10-CM | POA: Diagnosis not present

## 2018-12-18 DIAGNOSIS — Z08 Encounter for follow-up examination after completed treatment for malignant neoplasm: Secondary | ICD-10-CM | POA: Diagnosis not present

## 2018-12-18 DIAGNOSIS — D2271 Melanocytic nevi of right lower limb, including hip: Secondary | ICD-10-CM | POA: Diagnosis not present

## 2018-12-18 DIAGNOSIS — D225 Melanocytic nevi of trunk: Secondary | ICD-10-CM | POA: Diagnosis not present

## 2018-12-18 DIAGNOSIS — D2272 Melanocytic nevi of left lower limb, including hip: Secondary | ICD-10-CM | POA: Diagnosis not present

## 2018-12-18 DIAGNOSIS — Z85820 Personal history of malignant melanoma of skin: Secondary | ICD-10-CM | POA: Diagnosis not present

## 2018-12-18 DIAGNOSIS — D2261 Melanocytic nevi of right upper limb, including shoulder: Secondary | ICD-10-CM | POA: Diagnosis not present

## 2018-12-18 DIAGNOSIS — D2262 Melanocytic nevi of left upper limb, including shoulder: Secondary | ICD-10-CM | POA: Diagnosis not present

## 2019-03-04 ENCOUNTER — Other Ambulatory Visit: Payer: Self-pay | Admitting: Nurse Practitioner

## 2019-03-04 DIAGNOSIS — Z1231 Encounter for screening mammogram for malignant neoplasm of breast: Secondary | ICD-10-CM

## 2019-04-09 ENCOUNTER — Ambulatory Visit
Admission: RE | Admit: 2019-04-09 | Discharge: 2019-04-09 | Disposition: A | Discharge status: Home / Self Care | Payer: 59 | Source: Ambulatory Visit | Attending: Nurse Practitioner | Admitting: Nurse Practitioner

## 2019-04-09 ENCOUNTER — Other Ambulatory Visit: Payer: Self-pay

## 2019-04-09 DIAGNOSIS — Z1231 Encounter for screening mammogram for malignant neoplasm of breast: Secondary | ICD-10-CM | POA: Diagnosis not present

## 2019-05-06 DIAGNOSIS — M6289 Other specified disorders of muscle: Secondary | ICD-10-CM | POA: Diagnosis not present

## 2019-05-06 DIAGNOSIS — K581 Irritable bowel syndrome with constipation: Secondary | ICD-10-CM | POA: Diagnosis not present

## 2019-05-06 DIAGNOSIS — Z0000 Encounter for general adult medical examination without abnormal findings: Secondary | ICD-10-CM | POA: Diagnosis not present

## 2019-06-18 ENCOUNTER — Ambulatory Visit: Payer: Self-pay

## 2019-08-12 ENCOUNTER — Other Ambulatory Visit
Admission: RE | Admit: 2019-08-12 | Discharge: 2019-08-12 | Disposition: A | Discharge status: Home / Self Care | Payer: No Typology Code available for payment source | Source: Ambulatory Visit | Attending: Internal Medicine | Admitting: Internal Medicine

## 2019-08-12 DIAGNOSIS — Z20822 Contact with and (suspected) exposure to covid-19: Secondary | ICD-10-CM | POA: Insufficient documentation

## 2019-08-12 DIAGNOSIS — Z01812 Encounter for preprocedural laboratory examination: Secondary | ICD-10-CM | POA: Diagnosis not present

## 2019-08-12 LAB — SARS CORONAVIRUS 2 (TAT 6-24 HRS): SARS Coronavirus 2: NEGATIVE

## 2019-08-14 ENCOUNTER — Encounter: Payer: Self-pay | Admitting: Internal Medicine

## 2019-08-14 ENCOUNTER — Encounter
Admission: RE | Disposition: A | Discharge status: Home / Self Care | Payer: Self-pay | Source: Home / Self Care | Attending: Internal Medicine

## 2019-08-14 ENCOUNTER — Ambulatory Visit
Admission: RE | Admit: 2019-08-14 | Discharge: 2019-08-14 | Disposition: A | Discharge status: Home / Self Care | Payer: No Typology Code available for payment source | Attending: Internal Medicine | Admitting: Internal Medicine

## 2019-08-14 ENCOUNTER — Ambulatory Visit: Payer: No Typology Code available for payment source | Admitting: Anesthesiology

## 2019-08-14 DIAGNOSIS — K640 First degree hemorrhoids: Secondary | ICD-10-CM | POA: Diagnosis not present

## 2019-08-14 DIAGNOSIS — Z800 Family history of malignant neoplasm of digestive organs: Secondary | ICD-10-CM | POA: Diagnosis not present

## 2019-08-14 DIAGNOSIS — K5900 Constipation, unspecified: Secondary | ICD-10-CM | POA: Insufficient documentation

## 2019-08-14 DIAGNOSIS — Z85820 Personal history of malignant melanoma of skin: Secondary | ICD-10-CM | POA: Diagnosis not present

## 2019-08-14 DIAGNOSIS — K5730 Diverticulosis of large intestine without perforation or abscess without bleeding: Secondary | ICD-10-CM | POA: Diagnosis not present

## 2019-08-14 DIAGNOSIS — Z87891 Personal history of nicotine dependence: Secondary | ICD-10-CM | POA: Diagnosis not present

## 2019-08-14 HISTORY — PX: COLONOSCOPY WITH PROPOFOL: SHX5780

## 2019-08-14 SURGERY — COLONOSCOPY WITH PROPOFOL
Anesthesia: General

## 2019-08-14 MED ORDER — SODIUM CHLORIDE 0.9 % IV SOLN: INTRAVENOUS | Status: DC

## 2019-08-14 MED ORDER — PROPOFOL 500 MG/50ML IV EMUL
INTRAVENOUS | Status: AC
  Filled 2019-08-14: qty 50

## 2019-08-14 MED ORDER — PROPOFOL 10 MG/ML IV BOLUS
INTRAVENOUS | Status: DC | PRN
  Administered 2019-08-14: 50 mg via INTRAVENOUS

## 2019-08-14 MED ORDER — PROPOFOL 500 MG/50ML IV EMUL
INTRAVENOUS | Status: DC | PRN
  Administered 2019-08-14: 155 ug/kg/min via INTRAVENOUS

## 2019-08-14 NOTE — Transfer of Care (Signed)
Immediate Anesthesia Transfer of Care Note  Patient: Janet Sandoval  Procedure(s) Performed: COLONOSCOPY WITH PROPOFOL (N/A )  Patient Location: PACU  Anesthesia Type:General  Level of Consciousness: awake and alert   Airway & Oxygen Therapy: Patient Spontanous Breathing and Patient connected to nasal cannula oxygen  Post-op Assessment: Report given to RN and Post -op Vital signs reviewed and stable  Post vital signs: Reviewed and stable  Last Vitals:  Vitals Value Taken Time  BP 96/61 08/14/19 1109  Temp 36.6 C 08/14/19 1109  Pulse 83 08/14/19 1110  Resp 17 08/14/19 1110  SpO2 99 % 08/14/19 1110  Vitals shown include unvalidated device data.  Last Pain:  Vitals:   08/14/19 1109  TempSrc: Temporal  PainSc: Asleep         Complications: No apparent anesthesia complications

## 2019-08-14 NOTE — H&P (Signed)
Outpatient short stay form Pre-procedure 08/14/2019 10:47 AM Janet Sandoval K. Janet Sandoval, M.D.  Primary Physician: Janet Net, NP  Reason for visit:  Change in bowel habits (constipation)  History of present illness:  49 y/o female with hx of IBS-D now complains of several weeks of constipation. Denies hematochezia, abdominal pain, weight loss. Has family hx of colon cancer in 2 second degree relatives.     Current Facility-Administered Medications:  .  0.9 %  sodium chloride infusion, , Intravenous, Continuous, [place], Janet Pike, MD, Last Rate: 20 mL/hr at 08/14/19 1032, New Bag at 08/14/19 1032  Medications Prior to Admission  Medication Sig Dispense Refill Last Dose  . acetaminophen (TYLENOL) 500 MG tablet Take 1,000 mg by mouth every 8 (eight) hours as needed for mild pain or moderate pain.   Past Week at Unknown time  . ibuprofen (ADVIL,MOTRIN) 200 MG tablet Take 600-800 mg by mouth every 8 (eight) hours as needed for mild pain or moderate pain.   Past Month at Unknown time  . amoxicillin (AMOXIL) 500 MG capsule Take 1 capsule (500 mg total) by mouth 2 (two) times daily. (Patient not taking: Reported on 08/14/2019) 20 capsule 0 Not Taking at Unknown time  . gabapentin (NEURONTIN) 300 MG capsule Take 300 mg by mouth at bedtime.   Not Taking at Unknown time  . methocarbamol (ROBAXIN) 500 MG tablet Take 1 tablet (500 mg total) by mouth every 6 (six) hours as needed for muscle spasms. (Patient not taking: Reported on 08/14/2019) 60 tablet 0 Not Taking at Unknown time  . methylPREDNISolone (MEDROL DOSEPAK) 4 MG TBPK tablet Follow package directions (Patient not taking: Reported on 08/14/2019) 21 tablet 0 Not Taking at Unknown time  . ondansetron (ZOFRAN) 4 MG tablet Take 1 tablet (4 mg total) by mouth every 8 (eight) hours as needed for nausea or vomiting. 20 tablet 0   . oxyCODONE (ROXICODONE) 5 MG immediate release tablet Take 1 tablet (5 mg total) by mouth every 4 (four) hours as needed for  breakthrough pain. 30 tablet 0      No Known Allergies   Past Medical History:  Diagnosis Date  . Cancer ([place])    melanoma  . Cervical radiculopathy   . Complication of anesthesia    low BP  . PONV (postoperative nausea and vomiting)    after c-section  . Raynaud's disease     Review of systems:  Otherwise negative.    Physical Exam  Gen: Alert, oriented. Appears stated age.  HEENT: [place]/AT. PERRLA. Lungs: CTA, no wheezes. CV: RR nl S1, S2. Abd: soft, benign, no masses. BS+ Ext: No edema. Pulses 2+    Planned procedures: Proceed with colonoscopy. The patient understands the nature of the planned procedure, indications, risks, alternatives and potential complications including but not limited to bleeding, infection, perforation, damage to internal organs and possible oversedation/side effects from anesthesia. The patient agrees and gives consent to proceed.  Please refer to procedure notes for findings, recommendations and patient disposition/instructions.     Janet Sandoval K. Janet Sandoval, M.D. Gastroenterology 08/14/2019  10:47 AM

## 2019-08-14 NOTE — Op Note (Signed)
[facility] Gastroenterology Patient Name: Janet Sandoval Procedure Date: 08/14/2019 10:34 AM MRN: [id number] Account #: [id number] Date of Birth: 08/08/1970 Admit Type: Outpatient Age: 49 Room: [facility] ENDO ROOM 4 Gender: Female Note Status: Finalized Procedure:             Colonoscopy Indications:           Change in bowel habits, Change in stool caliber Providers:             Benay Pike. Alice Reichert MD, MD Referring MD:          Christena Flake. Raechel Ache, MD (Referring MD) Medicines:             Propofol per Anesthesia Complications:         No immediate complications. Procedure:             Pre-Anesthesia Assessment:                        - The risks and benefits of the procedure and the                         sedation options and risks were discussed with the                         patient. All questions were answered and informed                         consent was obtained.                        - Patient identification and proposed procedure were                         verified prior to the procedure by the nurse. The                         procedure was verified in the procedure room.                        - ASA Grade Assessment: II - A patient with mild                         systemic disease.                        - After reviewing the risks and benefits, the patient                         was deemed in satisfactory condition to undergo the                         procedure.                        After obtaining informed consent, the colonoscope was                         passed under direct vision. Throughout the procedure,                         the  patient's blood pressure, pulse, and oxygen                         saturations were monitored continuously. The                         Colonoscope was introduced through the anus and                         advanced to the the terminal ileum, with                         identification of the appendiceal  orifice and IC                         valve. The colonoscopy was performed without                         difficulty. The patient tolerated the procedure well.                         The quality of the bowel preparation was excellent.                         The terminal ileum, ileocecal valve, appendiceal                         orifice, and rectum were photographed. Findings:      The perianal and digital rectal examinations were normal. Pertinent       negatives include normal sphincter tone and no palpable rectal lesions.      Non-bleeding internal hemorrhoids were found during retroflexion. The       hemorrhoids were Grade I (internal hemorrhoids that do not prolapse).      A few small-mouthed diverticula were found in the sigmoid colon.      The exam was otherwise without abnormality on direct and retroflexion       views.      The terminal ileum appeared normal. Impression:            - Non-bleeding internal hemorrhoids.                        - Diverticulosis in the sigmoid colon.                        - The examination was otherwise normal on direct and                         retroflexion views.                        - No specimens collected. Recommendation:        - Patient has a contact number available for                         emergencies. The signs and symptoms of potential                         delayed complications were discussed with the patient.  Return to normal activities tomorrow. Written                         discharge instructions were provided to the patient.                        - Resume previous diet.                        - Continue present medications.                        - Repeat colonoscopy in 5 years for screening purposes.                        - Return to GI office in 6 months.                        - Follow up with Stephens November, GI Nurse                         Practioner, in office to discuss results and  monitor                         progress.                        - The findings and recommendations were discussed with                         the patient. Procedure Code(s):     --- Professional ---                        [phone], Colonoscopy, flexible; diagnostic, including                         collection of specimen(s) by brushing or washing, when                         performed (separate procedure) Diagnosis Code(s):     --- Professional ---                        K57.30, Diverticulosis of large intestine without                         perforation or abscess without bleeding                        R19.5, Other fecal abnormalities                        R19.4, Change in bowel habit                        K64.0, First degree hemorrhoids CPT copyright 2019 American Medical Association. All rights reserved. The codes documented in this report are preliminary and upon coder review may  be revised to meet current compliance requirements. Efrain Sella MD, MD 08/14/2019 11:10:47 AM This report has been signed electronically. Number of Addenda: 0 Note Initiated On: 08/14/2019 10:34 AM Scope  Withdrawal Time: 0 hours 6 minutes 20 seconds  Total Procedure Duration: 0 hours 10 minutes 48 seconds  Estimated Blood Loss:  Estimated blood loss: none. Estimated blood loss: none.      [facility]

## 2019-08-14 NOTE — Anesthesia Preprocedure Evaluation (Signed)
Anesthesia Evaluation  Patient identified by MRN, date of birth, ID band Patient awake    Reviewed: Allergy & Precautions, H&P , NPO status , Patient's Chart, lab work & pertinent test results  History of Anesthesia Complications (+) PONV and history of anesthetic complications  Airway Mallampati: I  TM Distance: >3 FB Neck ROM: full    Dental  (+) Chipped   Pulmonary neg shortness of breath, former smoker,           Cardiovascular Exercise Tolerance: Good (-) angina(-) Past MI and (-) DOE negative cardio ROS       Neuro/Psych  Neuromuscular disease negative psych ROS   GI/Hepatic negative GI ROS, Neg liver ROS,   Endo/Other  negative endocrine ROS  Renal/GU negative Renal ROS  negative genitourinary   Musculoskeletal   Abdominal   Peds  Hematology negative hematology ROS (+)   Anesthesia Other Findings Past Medical History: No date: Cancer ([place])     Comment:  melanoma No date: Cervical radiculopathy No date: Complication of anesthesia     Comment:  low BP No date: PONV (postoperative nausea and vomiting)     Comment:  after c-section No date: Raynaud's disease  Past Surgical History: No date: ABDOMINAL HYSTERECTOMY 01/09/2018: CERVICAL DISC ARTHROPLASTY; N/A     Comment:  Procedure: CERVICAL ANTERIOR [place] ARTHROPLASTYC5-7;                Surgeon: Meade Maw, MD;  Location: [facility] ORS;                Service: Neurosurgery;  Laterality: N/A; No date: CESAREAN SECTION     Reproductive/Obstetrics negative OB ROS                             Anesthesia Physical Anesthesia Plan  ASA: II  Anesthesia Plan: General   Post-op Pain Management:    Induction: Intravenous  PONV Risk Score and Plan: Propofol infusion and TIVA  Airway Management Planned: Natural Airway and Nasal Cannula  Additional Equipment:   Intra-op Plan:   Post-operative Plan:   Informed Consent:  I have reviewed the patients History and Physical, chart, labs and discussed the procedure including the risks, benefits and alternatives for the proposed anesthesia with the patient or authorized representative who has indicated his/her understanding and acceptance.     Dental Advisory Given  Plan Discussed with: Anesthesiologist, CRNA and Surgeon  Anesthesia Plan Comments: (Patient consented for risks of anesthesia including but not limited to:  - adverse reactions to medications - risk of intubation if required - damage to teeth, lips or other oral mucosa - sore throat or hoarseness - Damage to heart, brain, lungs or loss of life  Patient voiced understanding.)        Anesthesia Quick Evaluation

## 2019-08-14 NOTE — Anesthesia Postprocedure Evaluation (Signed)
Anesthesia Post Note  Patient: Janet Sandoval  Procedure(s) Performed: COLONOSCOPY WITH PROPOFOL (N/A )  Patient location during evaluation: Endoscopy Anesthesia Type: General Level of consciousness: awake and alert Pain management: pain level controlled Vital Signs Assessment: post-procedure vital signs reviewed and stable Respiratory status: spontaneous breathing, nonlabored ventilation, respiratory function stable and patient connected to nasal cannula oxygen Cardiovascular status: blood pressure returned to baseline and stable Postop Assessment: no apparent nausea or vomiting Anesthetic complications: no     Last Vitals:  Vitals:   08/14/19 1129 08/14/19 1139  BP: 103/79 (!) 105/58  Pulse: 76 70  Resp: 16 14  Temp:    SpO2: 100% (!) 83%    Last Pain:  Vitals:   08/14/19 1139  TempSrc:   PainSc: 0-No pain                 Precious Haws Roben Schliep

## 2019-08-14 NOTE — Interval H&P Note (Signed)
History and Physical Interval Note:  08/14/2019 10:48 AM  Janet Sandoval  has presented today for surgery, with the diagnosis of BH CHANGE.  The various methods of treatment have been discussed with the patient and family. After consideration of risks, benefits and other options for treatment, the patient has consented to  Procedure(s): COLONOSCOPY WITH PROPOFOL (N/A) as a surgical intervention.  The patient's history has been reviewed, patient examined, no change in status, stable for surgery.  I have reviewed the patient's chart and labs.  Questions were answered to the patient's satisfaction.     [place], [place]

## 2019-08-15 ENCOUNTER — Encounter: Payer: Self-pay | Admitting: *Deleted

## 2020-07-01 ENCOUNTER — Other Ambulatory Visit: Payer: Self-pay | Admitting: Dermatology

## 2020-07-29 ENCOUNTER — Other Ambulatory Visit: Payer: Self-pay | Admitting: Family Medicine

## 2020-08-05 ENCOUNTER — Other Ambulatory Visit: Payer: Self-pay | Admitting: Family Medicine

## 2020-11-03 ENCOUNTER — Other Ambulatory Visit: Payer: Self-pay | Admitting: Family Medicine

## 2020-11-03 ENCOUNTER — Other Ambulatory Visit (HOSPITAL_COMMUNITY): Payer: Self-pay | Admitting: Family Medicine

## 2020-11-03 DIAGNOSIS — R42 Dizziness and giddiness: Secondary | ICD-10-CM

## 2020-11-03 DIAGNOSIS — R519 Headache, unspecified: Secondary | ICD-10-CM

## 2020-11-03 DIAGNOSIS — R4701 Aphasia: Secondary | ICD-10-CM

## 2020-11-16 ENCOUNTER — Other Ambulatory Visit: Payer: Self-pay

## 2020-11-16 ENCOUNTER — Ambulatory Visit
Admission: RE | Admit: 2020-11-16 | Discharge: 2020-11-16 | Disposition: A | Discharge status: Home / Self Care | Payer: No Typology Code available for payment source | Source: Ambulatory Visit | Attending: Family Medicine | Admitting: Family Medicine

## 2020-11-16 DIAGNOSIS — R519 Headache, unspecified: Secondary | ICD-10-CM | POA: Insufficient documentation

## 2020-11-16 DIAGNOSIS — R42 Dizziness and giddiness: Secondary | ICD-10-CM | POA: Diagnosis present

## 2020-11-16 DIAGNOSIS — R4701 Aphasia: Secondary | ICD-10-CM | POA: Insufficient documentation

## 2020-12-16 ENCOUNTER — Other Ambulatory Visit: Payer: Self-pay | Admitting: Neurology

## 2020-12-16 DIAGNOSIS — R569 Unspecified convulsions: Secondary | ICD-10-CM

## 2020-12-17 ENCOUNTER — Other Ambulatory Visit: Payer: Self-pay | Admitting: Family Medicine

## 2020-12-17 DIAGNOSIS — Z1231 Encounter for screening mammogram for malignant neoplasm of breast: Secondary | ICD-10-CM

## 2020-12-23 ENCOUNTER — Ambulatory Visit: Payer: No Typology Code available for payment source

## 2020-12-24 ENCOUNTER — Ambulatory Visit
Admission: RE | Admit: 2020-12-24 | Discharge: 2020-12-24 | Disposition: A | Discharge status: Home / Self Care | Payer: No Typology Code available for payment source | Source: Ambulatory Visit | Attending: Neurology | Admitting: Neurology

## 2020-12-24 ENCOUNTER — Other Ambulatory Visit: Payer: Self-pay

## 2020-12-24 DIAGNOSIS — R569 Unspecified convulsions: Secondary | ICD-10-CM

## 2021-01-10 ENCOUNTER — Other Ambulatory Visit: Payer: Self-pay | Admitting: Cardiology

## 2021-01-10 DIAGNOSIS — G459 Transient cerebral ischemic attack, unspecified: Secondary | ICD-10-CM

## 2021-01-17 ENCOUNTER — Ambulatory Visit
Admission: RE | Admit: 2021-01-17 | Discharge: 2021-01-17 | Disposition: A | Discharge status: Home / Self Care | Payer: No Typology Code available for payment source | Source: Ambulatory Visit | Attending: Cardiology | Admitting: Cardiology

## 2021-01-17 DIAGNOSIS — G459 Transient cerebral ischemic attack, unspecified: Secondary | ICD-10-CM | POA: Diagnosis not present

## 2021-01-17 DIAGNOSIS — Z87891 Personal history of nicotine dependence: Secondary | ICD-10-CM | POA: Insufficient documentation

## 2021-01-17 LAB — ECHOCARDIOGRAM COMPLETE BUBBLE STUDY
Area-P 1/2: 4.74 cm2
Calc EF: 65.6 %
S' Lateral: 3.3 cm
Single Plane A2C EF: 66.1 %
Single Plane A4C EF: 65.3 %

## 2021-01-17 NOTE — Progress Notes (Signed)
*  PRELIMINARY RESULTS* Echocardiogram 2D Echocardiogram has been performed.  Janet Sandoval 01/17/2021, 9:44 AM

## 2021-01-25 ENCOUNTER — Ambulatory Visit
Admission: RE | Admit: 2021-01-25 | Discharge: 2021-01-25 | Disposition: A | Discharge status: Home / Self Care | Payer: No Typology Code available for payment source | Source: Ambulatory Visit | Attending: Family Medicine | Admitting: Family Medicine

## 2021-01-25 ENCOUNTER — Other Ambulatory Visit: Payer: Self-pay

## 2021-01-25 DIAGNOSIS — Z1231 Encounter for screening mammogram for malignant neoplasm of breast: Secondary | ICD-10-CM | POA: Diagnosis present

## 2021-01-25 MED FILL — Estrogens, Conjugated Vaginal Cream 0.625 MG/GM: VAGINAL | 30 days supply | Qty: 30 | Fill #0 | Status: CN

## 2021-01-27 ENCOUNTER — Ambulatory Visit: Payer: No Typology Code available for payment source

## 2021-02-01 ENCOUNTER — Other Ambulatory Visit: Payer: Self-pay

## 2021-02-01 MED FILL — Estrogens, Conjugated Vaginal Cream 0.625 MG/GM: VAGINAL | 30 days supply | Qty: 30 | Fill #0 | Status: AC

## 2021-04-07 ENCOUNTER — Other Ambulatory Visit: Payer: Self-pay

## 2021-04-07 MED ORDER — ESCITALOPRAM OXALATE 5 MG PO TABS
ORAL_TABLET | ORAL | 0 refills | Status: DC
  Filled 2021-04-07: qty 30, 30d supply, fill #0

## 2021-05-05 ENCOUNTER — Other Ambulatory Visit: Payer: Self-pay

## 2021-05-05 MED ORDER — CARESTART COVID-19 HOME TEST VI KIT
PACK | 0 refills | Status: AC
  Filled 2021-05-05: qty 2, 4d supply, fill #0

## 2021-05-05 MED ORDER — ESCITALOPRAM OXALATE 5 MG PO TABS
ORAL_TABLET | ORAL | 5 refills | Status: AC
  Filled 2021-05-05: qty 30, 30d supply, fill #0

## 2021-05-05 MED ORDER — HYDROCHLOROTHIAZIDE 12.5 MG PO TABS
ORAL_TABLET | ORAL | 2 refills | Status: AC
  Filled 2021-05-05: qty 30, 30d supply, fill #0

## 2021-06-01 ENCOUNTER — Other Ambulatory Visit: Payer: Self-pay

## 2021-06-01 MED ORDER — ESCITALOPRAM OXALATE 5 MG PO TABS
ORAL_TABLET | ORAL | 1 refills | Status: AC
  Filled 2021-06-01: qty 30, 30d supply, fill #0
  Filled 2021-12-05: qty 30, 30d supply, fill #1

## 2021-06-01 MED ORDER — HYDROCHLOROTHIAZIDE 12.5 MG PO TABS
ORAL_TABLET | ORAL | 1 refills | Status: AC
  Filled 2021-06-01: qty 90, 90d supply, fill #0

## 2021-07-04 ENCOUNTER — Other Ambulatory Visit: Payer: Self-pay

## 2021-07-04 MED ORDER — PREMARIN 0.625 MG/GM VA CREA
TOPICAL_CREAM | VAGINAL | 2 refills | Status: AC
  Filled 2021-07-04: qty 30, 30d supply, fill #0
  Filled 2021-12-05: qty 30, 30d supply, fill #1

## 2021-07-05 ENCOUNTER — Other Ambulatory Visit: Payer: Self-pay

## 2021-07-05 MED ORDER — ESCITALOPRAM OXALATE 5 MG PO TABS
ORAL_TABLET | ORAL | 1 refills | Status: AC
  Filled 2021-07-05: qty 30, 30d supply, fill #0
  Filled 2021-08-09: qty 30, 30d supply, fill #1

## 2021-07-19 ENCOUNTER — Other Ambulatory Visit: Payer: Self-pay

## 2021-07-19 MED ORDER — DICLOFENAC SODIUM 75 MG PO TBEC
DELAYED_RELEASE_TABLET | ORAL | 0 refills | Status: AC
  Filled 2021-07-19: qty 60, 30d supply, fill #0

## 2021-08-09 ENCOUNTER — Other Ambulatory Visit: Payer: Self-pay

## 2021-09-06 ENCOUNTER — Other Ambulatory Visit: Payer: Self-pay

## 2021-09-06 MED ORDER — ESCITALOPRAM OXALATE 5 MG PO TABS
ORAL_TABLET | ORAL | 1 refills | Status: AC
  Filled 2021-09-06: qty 30, 30d supply, fill #0
  Filled 2021-10-31: qty 30, 30d supply, fill #1

## 2021-09-06 MED ORDER — HYDROCHLOROTHIAZIDE 12.5 MG PO TABS
ORAL_TABLET | ORAL | 1 refills | Status: AC
  Filled 2021-09-06: qty 90, 90d supply, fill #0
  Filled 2021-12-05: qty 28, 28d supply, fill #1
  Filled 2021-12-05: qty 62, 62d supply, fill #1

## 2021-10-31 ENCOUNTER — Other Ambulatory Visit: Payer: Self-pay

## 2021-11-03 ENCOUNTER — Other Ambulatory Visit: Payer: Self-pay

## 2021-12-05 ENCOUNTER — Other Ambulatory Visit: Payer: Self-pay

## 2021-12-07 ENCOUNTER — Other Ambulatory Visit: Payer: Self-pay

## 2022-01-02 ENCOUNTER — Other Ambulatory Visit: Payer: Self-pay

## 2022-03-09 ENCOUNTER — Other Ambulatory Visit: Payer: Self-pay | Admitting: Family Medicine

## 2022-03-09 DIAGNOSIS — Z1231 Encounter for screening mammogram for malignant neoplasm of breast: Secondary | ICD-10-CM

## 2022-04-12 ENCOUNTER — Ambulatory Visit
Admission: RE | Admit: 2022-04-12 | Discharge: 2022-04-12 | Disposition: A | Discharge status: Home / Self Care | Payer: No Typology Code available for payment source | Source: Ambulatory Visit | Attending: Family Medicine | Admitting: Family Medicine

## 2022-04-12 DIAGNOSIS — Z1231 Encounter for screening mammogram for malignant neoplasm of breast: Secondary | ICD-10-CM | POA: Insufficient documentation

## 2022-05-04 ENCOUNTER — Other Ambulatory Visit: Payer: Self-pay

## 2022-05-04 MED ORDER — HYDROCHLOROTHIAZIDE 12.5 MG PO TABS
ORAL_TABLET | ORAL | 1 refills | Status: AC
  Filled 2022-05-04: qty 90, 90d supply, fill #0

## 2022-05-05 ENCOUNTER — Other Ambulatory Visit: Payer: Self-pay

## 2022-05-31 ENCOUNTER — Other Ambulatory Visit: Payer: Self-pay

## 2022-05-31 MED ORDER — SPIRONOLACTONE 50 MG PO TABS
50.0000 mg | ORAL_TABLET | Freq: Two times a day (BID) | ORAL | 3 refills | Status: AC
  Filled 2022-05-31: qty 180, 90d supply, fill #0

## 2022-07-17 ENCOUNTER — Other Ambulatory Visit: Payer: Self-pay

## 2022-07-17 MED ORDER — LINZESS 72 MCG PO CAPS
72.0000 ug | ORAL_CAPSULE | Freq: Every day | ORAL | 3 refills | Status: AC
  Filled 2022-07-17: qty 30, 30d supply, fill #0

## 2022-07-31 ENCOUNTER — Other Ambulatory Visit: Payer: Self-pay

## 2022-09-11 ENCOUNTER — Telehealth: Payer: Self-pay

## 2022-09-11 NOTE — Telephone Encounter (Signed)
Spoke with pt to establish care for rectocele and refer to urogyn she stated shed call back or call them directly

## 2022-09-24 IMAGING — MR MR MRA NECK W/O CM
1 series · 40 of 48 positions shown · non-contrast
Comparison: Brain MRI 11/16/2020.  Same day MRA head 12/24/2020.

CLINICAL DATA: Seizure-like activity (HCC) additional history
provided by scanning technologist: Patient reports TIA in [REDACTED] of
this year, prior cervical spine fusion.

EXAM:
MRA NECK WITHOUT CONTRAST
TECHNIQUE: Angiographic images of the neck were acquired using MRA technique
without intravenous contrast. Carotid stenosis measurements (when
applicable) are obtained utilizing NASCET criteria, using the distal
internal carotid diameter as the denominator.

[Series 6: TOF · axial · 0.6mm · 0.52mm/px · z∈[-258,-62]mm · 40 of 344 slices shown]
[im 1/344]
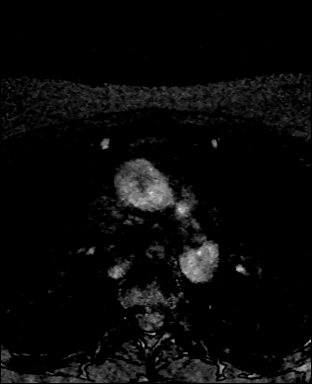
[im 8/344]
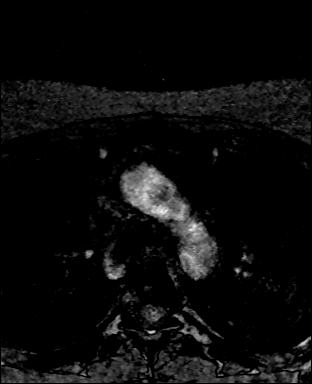
[im 15/344]
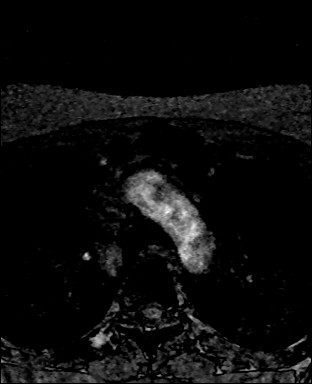
[im 22/344]
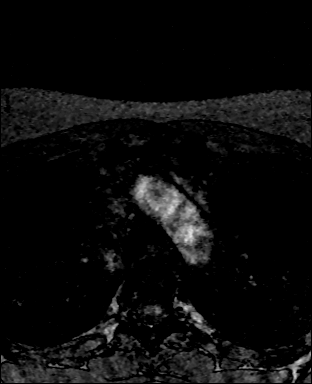
[im 30/344]
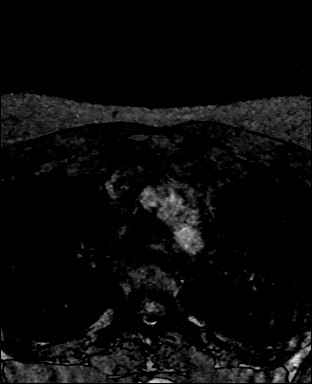
[im 37/344]
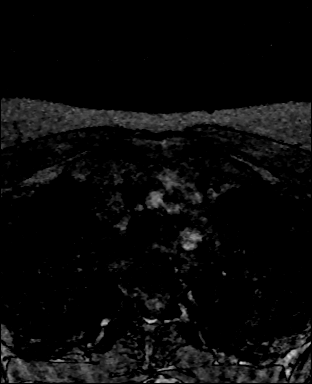
[im 44/344]
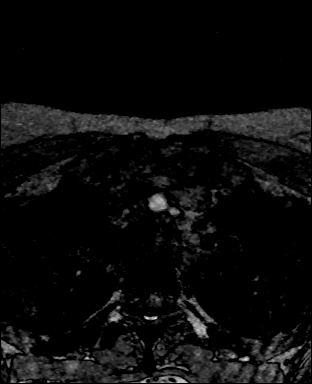
[im 52/344]
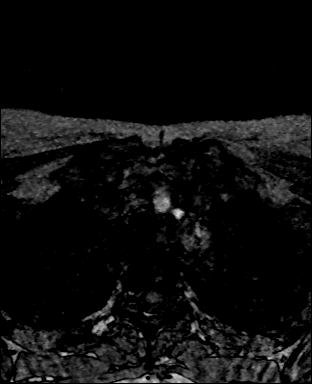
[im 59/344]
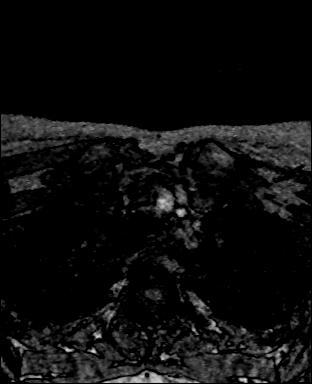
[im 66/344]
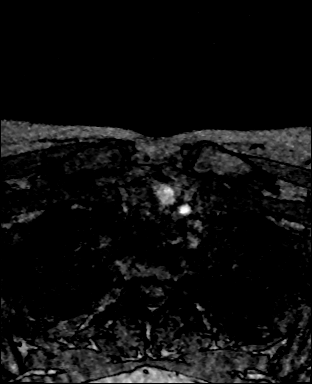
[im 73/344]
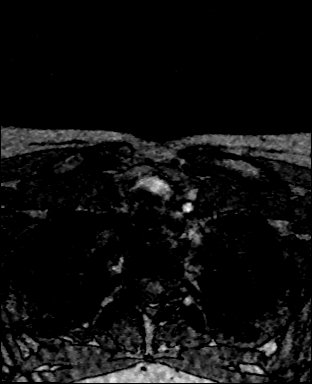
[im 81/344]
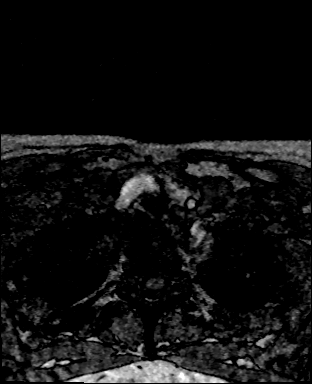
[im 88/344]
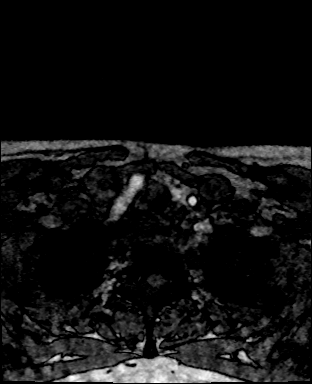
[im 95/344]
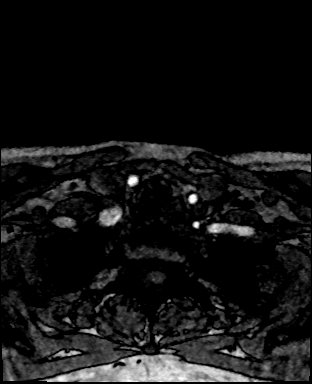
[im 103/344]
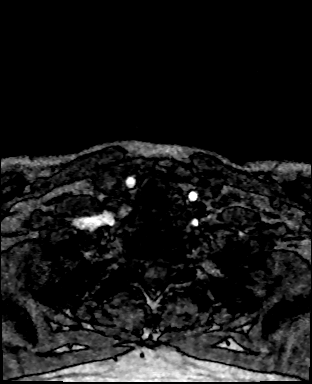
[im 110/344]
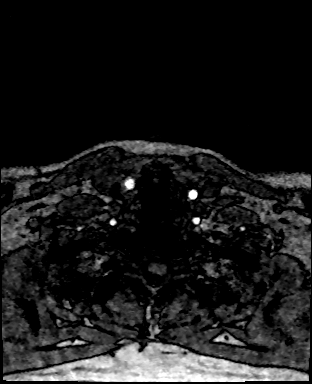
[im 117/344]
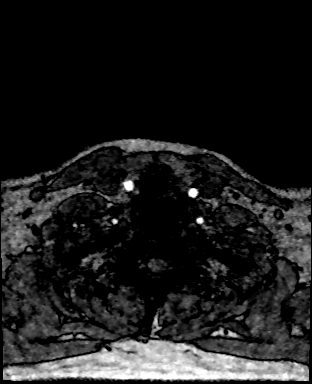
[im 125/344]
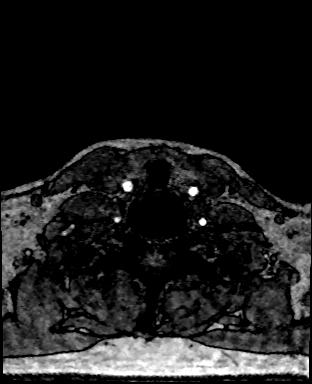
[im 132/344]
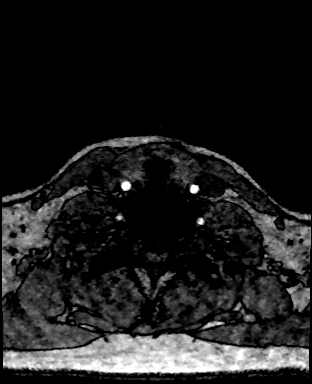
[im 139/344]
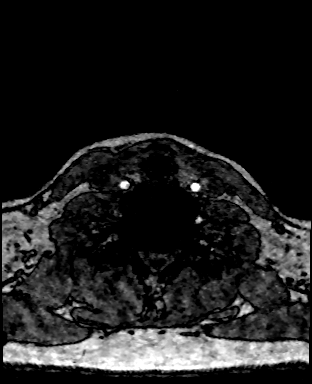
[im 146/344]
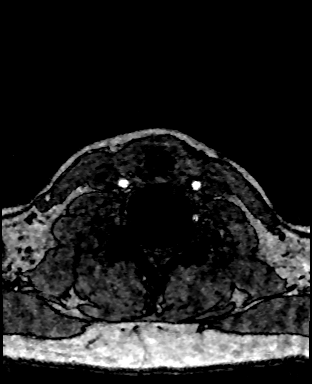
[im 154/344]
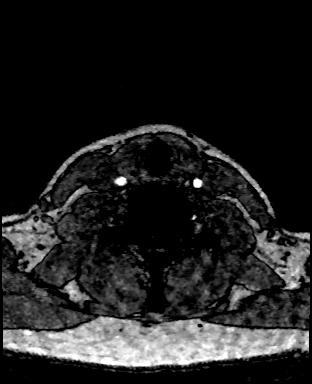
[im 161/344]
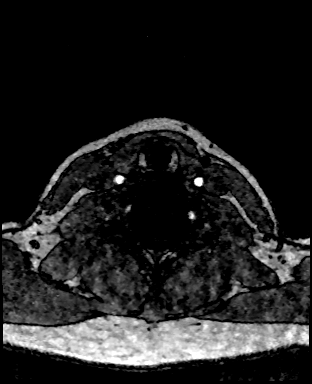
[im 168/344]
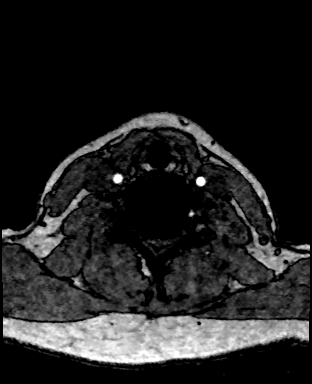
[im 176/344]
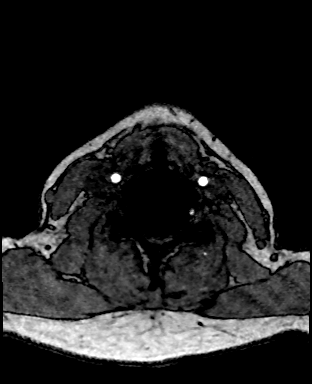
[im 183/344]
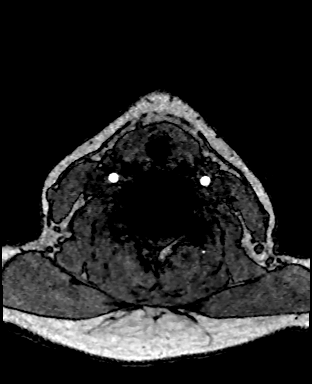
[im 190/344]
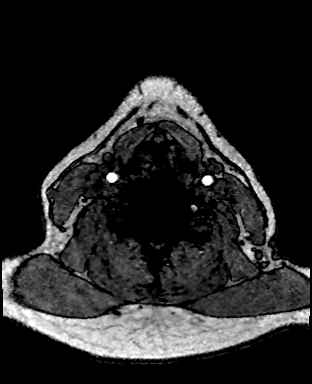
[im 198/344]
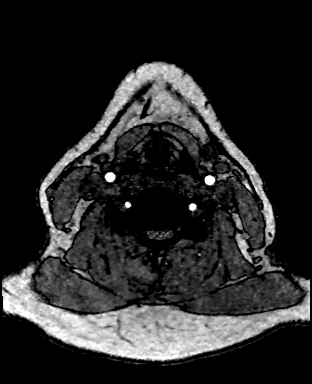
[im 205/344]
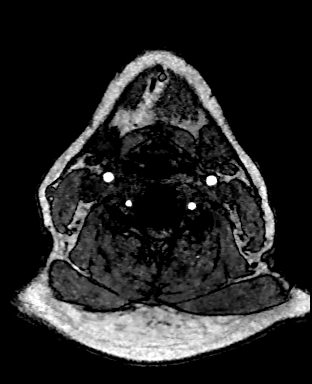
[im 212/344]
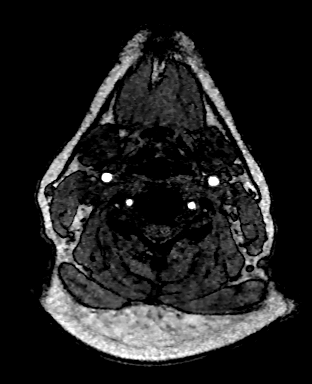
[im 219/344]
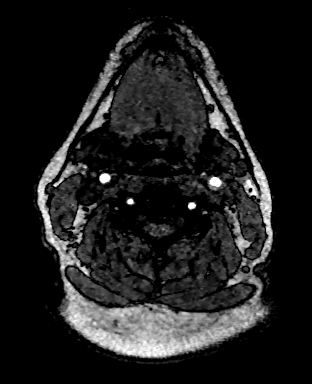
[im 227/344]
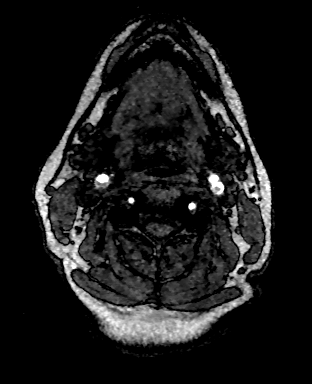
[im 234/344]
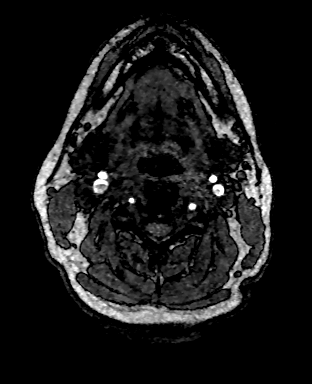
[im 241/344]
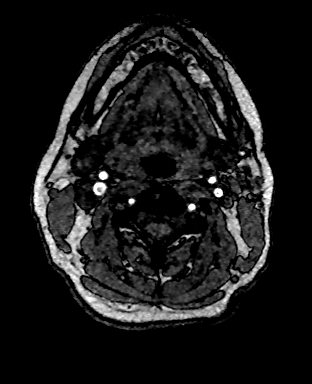
[im 249/344]
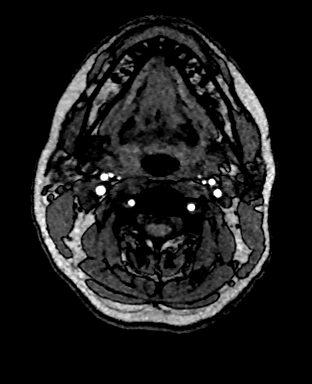
[im 256/344]
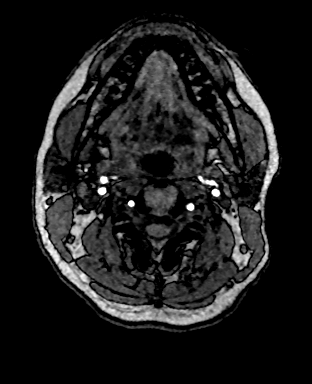
[im 263/344]
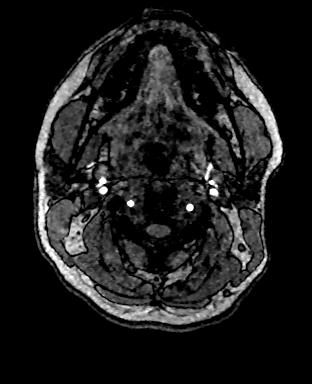
[im 285/344]
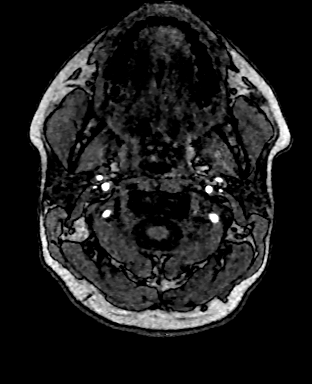
[im 292/344]
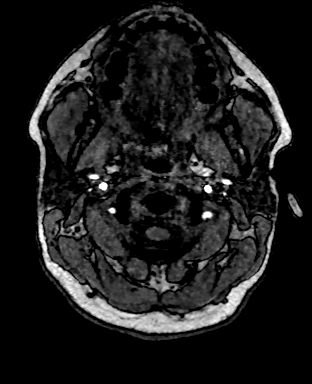
[im 329/344]
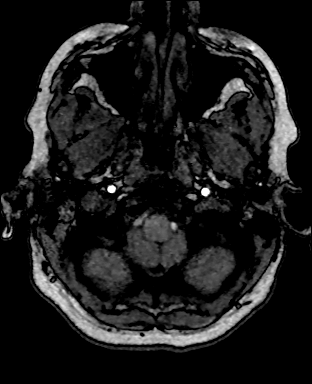

[40 of 48 positions shown; findings below may reference images not displayed]

FINDINGS: Aortic arch: Standard aortic branching.

Right carotid system: Patent within the neck without appreciable
hemodynamically significant stenosis (50% or greater).

Left carotid system: Patent within the neck without appreciable
hemodynamically significant stenosis (50% or greater).

Vertebral arteries: Susceptibility artifact arising from cervical
spinal fusion hardware partially obscures the V2 vertebral arteries
bilaterally. Within this limitation, the vertebral arteries are
codominant and patent within the neck with antegrade flow, and
without appreciable hemodynamically significant stenosis (50% or
greater).
IMPRESSION: Susceptibility artifact arising from cervical spinal fusion hardware
partially obscures the V2 vertebral arteries bilaterally.

Within this limitation, the bilateral common carotid, internal
carotid and vertebral arteries are patent within the neck without
hemodynamically significant stenosis.

## 2022-10-18 ENCOUNTER — Other Ambulatory Visit: Payer: Self-pay | Admitting: Family Medicine

## 2022-10-18 DIAGNOSIS — Z0000 Encounter for general adult medical examination without abnormal findings: Secondary | ICD-10-CM

## 2022-10-26 ENCOUNTER — Ambulatory Visit
Admission: RE | Admit: 2022-10-26 | Discharge: 2022-10-26 | Disposition: A | Discharge status: Home / Self Care | Payer: No Typology Code available for payment source | Source: Ambulatory Visit | Attending: Family Medicine | Admitting: Family Medicine

## 2022-10-26 DIAGNOSIS — Z0000 Encounter for general adult medical examination without abnormal findings: Secondary | ICD-10-CM | POA: Insufficient documentation

## 2022-11-01 ENCOUNTER — Other Ambulatory Visit: Payer: Self-pay | Admitting: Family Medicine

## 2022-11-01 DIAGNOSIS — K7689 Other specified diseases of liver: Secondary | ICD-10-CM

## 2022-11-16 ENCOUNTER — Ambulatory Visit
Admission: RE | Admit: 2022-11-16 | Discharge: 2022-11-16 | Disposition: A | Discharge status: Home / Self Care | Payer: No Typology Code available for payment source | Source: Ambulatory Visit | Attending: Family Medicine | Admitting: Family Medicine

## 2022-11-16 DIAGNOSIS — K7689 Other specified diseases of liver: Secondary | ICD-10-CM | POA: Insufficient documentation

## 2022-11-16 MED ORDER — GADOBUTROL 1 MMOL/ML IV SOLN
6.0000 mL | Freq: Once | INTRAVENOUS | Status: AC | PRN
  Administered 2022-11-16: 6 mL via INTRAVENOUS

## 2023-02-07 ENCOUNTER — Ambulatory Visit: Payer: No Typology Code available for payment source | Admitting: Obstetrics and Gynecology

## 2023-03-02 ENCOUNTER — Other Ambulatory Visit: Payer: Self-pay

## 2023-03-02 MED ORDER — ESTRADIOL 0.1 MG/GM VA CREA: 0.5000 g | TOPICAL_CREAM | VAGINAL | 1 refills | Status: AC

## 2023-03-02 MED ORDER — ESTROGENS CONJUGATED 0.625 MG/GM VA CREA: 0.5000 g | TOPICAL_CREAM | VAGINAL | 2 refills | Status: AC

## 2023-03-02 MED ORDER — DAPSONE 7.5 % EX GEL
Freq: Every day | CUTANEOUS | 3 refills | Status: AC
  Filled 2023-03-02: qty 60, 90d supply, fill #0

## 2023-03-02 MED ORDER — LINACLOTIDE 72 MCG PO CAPS: 72.0000 ug | ORAL_CAPSULE | Freq: Every day | ORAL | 1 refills | Status: AC

## 2023-03-02 MED ORDER — SPIRONOLACTONE 50 MG PO TABS
50.0000 mg | ORAL_TABLET | Freq: Two times a day (BID) | ORAL | 0 refills | Status: AC
  Filled 2023-03-19: qty 180, 90d supply, fill #0

## 2023-03-04 ENCOUNTER — Other Ambulatory Visit: Payer: Self-pay

## 2023-03-06 ENCOUNTER — Other Ambulatory Visit: Payer: Self-pay

## 2023-03-07 ENCOUNTER — Other Ambulatory Visit: Payer: Self-pay

## 2023-03-08 ENCOUNTER — Other Ambulatory Visit: Payer: Self-pay

## 2023-03-08 MED ORDER — HYDROCHLOROTHIAZIDE 12.5 MG PO TABS
12.5000 mg | ORAL_TABLET | Freq: Every day | ORAL | 1 refills | Status: DC
Start: 2023-03-08 — End: 2023-07-25
  Filled 2023-03-08: qty 90, 90d supply, fill #0
  Filled 2023-06-04: qty 90, 90d supply, fill #1

## 2023-03-12 DIAGNOSIS — H9313 Tinnitus, bilateral: Secondary | ICD-10-CM | POA: Diagnosis not present

## 2023-03-12 DIAGNOSIS — H6983 Other specified disorders of Eustachian tube, bilateral: Secondary | ICD-10-CM | POA: Diagnosis not present

## 2023-03-19 ENCOUNTER — Other Ambulatory Visit: Payer: Self-pay

## 2023-06-04 ENCOUNTER — Other Ambulatory Visit: Payer: Self-pay

## 2023-06-04 MED ORDER — ESCITALOPRAM OXALATE 5 MG PO TABS
5.0000 mg | ORAL_TABLET | Freq: Every day | ORAL | 2 refills | Status: AC
  Filled 2023-06-04: qty 30, 30d supply, fill #0
  Filled 2023-07-04: qty 30, 30d supply, fill #1
  Filled 2024-04-25: qty 30, 30d supply, fill #2

## 2023-06-05 ENCOUNTER — Other Ambulatory Visit: Payer: Self-pay

## 2023-06-26 ENCOUNTER — Other Ambulatory Visit: Payer: Self-pay

## 2023-06-28 ENCOUNTER — Other Ambulatory Visit: Payer: Self-pay

## 2023-06-28 MED ORDER — SPIRONOLACTONE 50 MG PO TABS
50.0000 mg | ORAL_TABLET | Freq: Two times a day (BID) | ORAL | 2 refills | Status: AC
  Filled 2023-06-28: qty 180, 90d supply, fill #0
  Filled 2023-09-27: qty 180, 90d supply, fill #1
  Filled 2023-12-17: qty 180, 90d supply, fill #2

## 2023-06-29 ENCOUNTER — Other Ambulatory Visit: Payer: Self-pay

## 2023-07-25 ENCOUNTER — Other Ambulatory Visit: Payer: Self-pay

## 2023-07-25 DIAGNOSIS — R7303 Prediabetes: Secondary | ICD-10-CM | POA: Diagnosis not present

## 2023-07-25 DIAGNOSIS — K581 Irritable bowel syndrome with constipation: Secondary | ICD-10-CM | POA: Diagnosis not present

## 2023-07-25 DIAGNOSIS — G459 Transient cerebral ischemic attack, unspecified: Secondary | ICD-10-CM | POA: Diagnosis not present

## 2023-07-25 DIAGNOSIS — N951 Menopausal and female climacteric states: Secondary | ICD-10-CM | POA: Diagnosis not present

## 2023-07-25 MED ORDER — ESCITALOPRAM OXALATE 5 MG PO TABS
5.0000 mg | ORAL_TABLET | Freq: Every day | ORAL | 1 refills | Status: DC
Start: 2023-07-25 — End: 2024-01-29
  Filled 2023-07-25: qty 90, 90d supply, fill #0
  Filled 2023-10-26: qty 90, 90d supply, fill #1

## 2023-07-25 MED ORDER — DICLOFENAC SODIUM 75 MG PO TBEC
75.0000 mg | DELAYED_RELEASE_TABLET | Freq: Two times a day (BID) | ORAL | 1 refills | Status: AC
  Filled 2023-07-25: qty 90, 45d supply, fill #0
  Filled 2024-04-01 – 2024-04-25 (×2): qty 90, 45d supply, fill #1

## 2023-07-25 MED ORDER — HYDROCHLOROTHIAZIDE 12.5 MG PO TABS
12.5000 mg | ORAL_TABLET | Freq: Every day | ORAL | 1 refills | Status: DC
Start: 2023-07-25 — End: 2023-12-14
  Filled 2023-07-25 – 2023-08-27 (×2): qty 90, 90d supply, fill #0
  Filled 2023-11-26: qty 90, 90d supply, fill #1

## 2023-07-25 MED ORDER — LINZESS 72 MCG PO CAPS
72.0000 ug | ORAL_CAPSULE | Freq: Every day | ORAL | 1 refills | Status: DC
  Filled 2023-07-25: qty 90, 90d supply, fill #0

## 2023-07-30 ENCOUNTER — Other Ambulatory Visit: Payer: Self-pay

## 2023-08-01 ENCOUNTER — Other Ambulatory Visit: Payer: Self-pay

## 2023-08-28 ENCOUNTER — Other Ambulatory Visit: Payer: Self-pay

## 2023-09-26 DIAGNOSIS — D2272 Melanocytic nevi of left lower limb, including hip: Secondary | ICD-10-CM | POA: Diagnosis not present

## 2023-09-26 DIAGNOSIS — L821 Other seborrheic keratosis: Secondary | ICD-10-CM | POA: Diagnosis not present

## 2023-09-26 DIAGNOSIS — D0461 Carcinoma in situ of skin of right upper limb, including shoulder: Secondary | ICD-10-CM | POA: Diagnosis not present

## 2023-09-26 DIAGNOSIS — D2271 Melanocytic nevi of right lower limb, including hip: Secondary | ICD-10-CM | POA: Diagnosis not present

## 2023-09-26 DIAGNOSIS — R58 Hemorrhage, not elsewhere classified: Secondary | ICD-10-CM | POA: Diagnosis not present

## 2023-09-26 DIAGNOSIS — D225 Melanocytic nevi of trunk: Secondary | ICD-10-CM | POA: Diagnosis not present

## 2023-09-26 DIAGNOSIS — L2989 Other pruritus: Secondary | ICD-10-CM | POA: Diagnosis not present

## 2023-09-26 DIAGNOSIS — D2261 Melanocytic nevi of right upper limb, including shoulder: Secondary | ICD-10-CM | POA: Diagnosis not present

## 2023-09-26 DIAGNOSIS — D485 Neoplasm of uncertain behavior of skin: Secondary | ICD-10-CM | POA: Diagnosis not present

## 2023-09-26 DIAGNOSIS — R208 Other disturbances of skin sensation: Secondary | ICD-10-CM | POA: Diagnosis not present

## 2023-09-26 DIAGNOSIS — D2262 Melanocytic nevi of left upper limb, including shoulder: Secondary | ICD-10-CM | POA: Diagnosis not present

## 2023-11-22 DIAGNOSIS — D0461 Carcinoma in situ of skin of right upper limb, including shoulder: Secondary | ICD-10-CM | POA: Diagnosis not present

## 2023-11-29 ENCOUNTER — Other Ambulatory Visit: Payer: Self-pay

## 2023-12-04 DIAGNOSIS — R7303 Prediabetes: Secondary | ICD-10-CM | POA: Diagnosis not present

## 2023-12-04 DIAGNOSIS — G459 Transient cerebral ischemic attack, unspecified: Secondary | ICD-10-CM | POA: Diagnosis not present

## 2023-12-04 DIAGNOSIS — Z0000 Encounter for general adult medical examination without abnormal findings: Secondary | ICD-10-CM | POA: Diagnosis not present

## 2023-12-04 DIAGNOSIS — K581 Irritable bowel syndrome with constipation: Secondary | ICD-10-CM | POA: Diagnosis not present

## 2023-12-14 ENCOUNTER — Other Ambulatory Visit: Payer: Self-pay

## 2023-12-14 DIAGNOSIS — E782 Mixed hyperlipidemia: Secondary | ICD-10-CM | POA: Diagnosis not present

## 2023-12-14 DIAGNOSIS — K581 Irritable bowel syndrome with constipation: Secondary | ICD-10-CM | POA: Diagnosis not present

## 2023-12-14 DIAGNOSIS — Z0000 Encounter for general adult medical examination without abnormal findings: Secondary | ICD-10-CM | POA: Diagnosis not present

## 2023-12-14 DIAGNOSIS — Z1331 Encounter for screening for depression: Secondary | ICD-10-CM | POA: Diagnosis not present

## 2023-12-14 DIAGNOSIS — R7303 Prediabetes: Secondary | ICD-10-CM | POA: Diagnosis not present

## 2023-12-14 DIAGNOSIS — Z87891 Personal history of nicotine dependence: Secondary | ICD-10-CM | POA: Diagnosis not present

## 2023-12-14 DIAGNOSIS — N951 Menopausal and female climacteric states: Secondary | ICD-10-CM | POA: Diagnosis not present

## 2023-12-14 MED ORDER — HYDROCHLOROTHIAZIDE 12.5 MG PO TABS
12.5000 mg | ORAL_TABLET | Freq: Every day | ORAL | 1 refills | Status: AC
  Filled 2023-12-14 – 2024-04-25 (×3): qty 90, 90d supply, fill #0

## 2023-12-14 MED ORDER — DICLOFENAC SODIUM 75 MG PO TBEC
75.0000 mg | DELAYED_RELEASE_TABLET | Freq: Two times a day (BID) | ORAL | 1 refills | Status: AC
  Filled 2023-12-14: qty 90, 45d supply, fill #0

## 2023-12-14 MED ORDER — ESCITALOPRAM OXALATE 5 MG PO TABS
5.0000 mg | ORAL_TABLET | Freq: Every day | ORAL | 1 refills | Status: AC
  Filled 2023-12-14 – 2024-04-25 (×3): qty 90, 90d supply, fill #0

## 2023-12-17 ENCOUNTER — Other Ambulatory Visit: Payer: Self-pay | Admitting: Family Medicine

## 2023-12-17 ENCOUNTER — Other Ambulatory Visit: Payer: Self-pay

## 2023-12-17 DIAGNOSIS — Z87891 Personal history of nicotine dependence: Secondary | ICD-10-CM

## 2023-12-17 DIAGNOSIS — Z0000 Encounter for general adult medical examination without abnormal findings: Secondary | ICD-10-CM

## 2023-12-18 ENCOUNTER — Ambulatory Visit
Admission: RE | Admit: 2023-12-18 | Discharge: 2023-12-18 | Disposition: A | Discharge status: Home / Self Care | Source: Ambulatory Visit | Attending: Family Medicine | Admitting: Family Medicine

## 2023-12-18 DIAGNOSIS — Z0000 Encounter for general adult medical examination without abnormal findings: Secondary | ICD-10-CM | POA: Diagnosis not present

## 2023-12-18 DIAGNOSIS — Z87891 Personal history of nicotine dependence: Secondary | ICD-10-CM | POA: Insufficient documentation

## 2024-01-29 ENCOUNTER — Other Ambulatory Visit: Payer: Self-pay

## 2024-01-29 MED ORDER — ESCITALOPRAM OXALATE 5 MG PO TABS
5.0000 mg | ORAL_TABLET | Freq: Every day | ORAL | 0 refills | Status: AC
  Filled 2024-01-29: qty 90, 90d supply, fill #0

## 2024-02-21 ENCOUNTER — Other Ambulatory Visit: Payer: Self-pay

## 2024-02-21 MED ORDER — HYDROCHLOROTHIAZIDE 12.5 MG PO TABS
12.5000 mg | ORAL_TABLET | Freq: Every day | ORAL | 1 refills | Status: AC
  Filled 2024-02-21: qty 90, 90d supply, fill #0
  Filled 2024-05-09: qty 90, 90d supply, fill #1
  Filled 2024-05-14: qty 90, 90d supply, fill #0

## 2024-03-14 ENCOUNTER — Other Ambulatory Visit: Payer: Self-pay

## 2024-03-17 ENCOUNTER — Other Ambulatory Visit: Payer: Self-pay

## 2024-03-17 MED ORDER — SPIRONOLACTONE 50 MG PO TABS
50.0000 mg | ORAL_TABLET | Freq: Two times a day (BID) | ORAL | 2 refills | Status: AC
  Filled 2024-03-17: qty 180, 90d supply, fill #0
  Filled 2024-04-01 – 2024-06-13 (×2): qty 180, 90d supply, fill #1

## 2024-03-18 ENCOUNTER — Other Ambulatory Visit: Payer: Self-pay

## 2024-04-01 ENCOUNTER — Other Ambulatory Visit: Payer: Self-pay

## 2024-04-11 ENCOUNTER — Other Ambulatory Visit: Payer: Self-pay

## 2024-04-25 ENCOUNTER — Encounter: Payer: Self-pay | Admitting: Pharmacist

## 2024-04-25 ENCOUNTER — Other Ambulatory Visit: Payer: Self-pay

## 2024-04-25 ENCOUNTER — Other Ambulatory Visit (HOSPITAL_COMMUNITY): Payer: Self-pay

## 2024-05-14 ENCOUNTER — Other Ambulatory Visit (HOSPITAL_COMMUNITY): Payer: Self-pay

## 2024-05-14 ENCOUNTER — Other Ambulatory Visit: Payer: Self-pay

## 2024-05-16 ENCOUNTER — Other Ambulatory Visit (HOSPITAL_COMMUNITY): Payer: Self-pay

## 2024-06-09 ENCOUNTER — Encounter: Payer: Self-pay | Admitting: *Deleted

## 2024-06-14 ENCOUNTER — Other Ambulatory Visit: Payer: Self-pay

## 2024-06-19 ENCOUNTER — Other Ambulatory Visit: Payer: Self-pay

## 2024-06-19 MED ORDER — ESTRADIOL 10 MCG VA TABS: 10.0000 ug | ORAL_TABLET | VAGINAL | 3 refills | Status: AC

## 2024-06-19 MED ORDER — HYDROCHLOROTHIAZIDE 12.5 MG PO TABS: 12.5000 mg | ORAL_TABLET | Freq: Every day | ORAL | 1 refills | Status: AC

## 2024-06-19 MED ORDER — SPIRONOLACTONE 50 MG PO TABS: 50.0000 mg | ORAL_TABLET | Freq: Two times a day (BID) | ORAL | 1 refills | Status: AC

## 2024-06-19 MED ORDER — ESCITALOPRAM OXALATE 5 MG PO TABS: 5.0000 mg | ORAL_TABLET | Freq: Every day | ORAL | 1 refills | Status: AC
